# Patient Record
Sex: Female | Born: 1945 | Race: White | Hispanic: No | Smoking: Never smoker
Health system: Southern US, Community
[De-identification: ages and names within clinical notes are randomized; demographics above are authoritative.]

## PROBLEM LIST (undated history)

## (undated) DIAGNOSIS — R112 Nausea with vomiting, unspecified: Secondary | ICD-10-CM

## (undated) DIAGNOSIS — J189 Pneumonia, unspecified organism: Secondary | ICD-10-CM

## (undated) DIAGNOSIS — J45909 Unspecified asthma, uncomplicated: Secondary | ICD-10-CM

## (undated) DIAGNOSIS — C801 Malignant (primary) neoplasm, unspecified: Secondary | ICD-10-CM

## (undated) DIAGNOSIS — I10 Essential (primary) hypertension: Secondary | ICD-10-CM

## (undated) DIAGNOSIS — F419 Anxiety disorder, unspecified: Secondary | ICD-10-CM

## (undated) DIAGNOSIS — Z9889 Other specified postprocedural states: Secondary | ICD-10-CM

## (undated) DIAGNOSIS — Z9189 Other specified personal risk factors, not elsewhere classified: Secondary | ICD-10-CM

## (undated) DIAGNOSIS — T4145XA Adverse effect of unspecified anesthetic, initial encounter: Secondary | ICD-10-CM

## (undated) DIAGNOSIS — H9319 Tinnitus, unspecified ear: Secondary | ICD-10-CM

## (undated) DIAGNOSIS — R7303 Prediabetes: Secondary | ICD-10-CM

## (undated) DIAGNOSIS — Z889 Allergy status to unspecified drugs, medicaments and biological substances status: Secondary | ICD-10-CM

## (undated) DIAGNOSIS — Z8709 Personal history of other diseases of the respiratory system: Secondary | ICD-10-CM

## (undated) DIAGNOSIS — N189 Chronic kidney disease, unspecified: Secondary | ICD-10-CM

## (undated) DIAGNOSIS — K219 Gastro-esophageal reflux disease without esophagitis: Secondary | ICD-10-CM

## (undated) DIAGNOSIS — M1990 Unspecified osteoarthritis, unspecified site: Secondary | ICD-10-CM

## (undated) HISTORY — PX: EYE SURGERY: SHX253

## (undated) HISTORY — PX: OTHER SURGICAL HISTORY: SHX169

## (undated) HISTORY — PX: TONSILLECTOMY: SUR1361

## (undated) HISTORY — PX: ABDOMINAL HYSTERECTOMY: SHX81

---

## 1987-12-30 HISTORY — PX: OTHER SURGICAL HISTORY: SHX169

## 2011-12-30 HISTORY — PX: OTHER SURGICAL HISTORY: SHX169

## 2015-12-28 DIAGNOSIS — J4540 Moderate persistent asthma, uncomplicated: Secondary | ICD-10-CM | POA: Insufficient documentation

## 2015-12-28 DIAGNOSIS — K219 Gastro-esophageal reflux disease without esophagitis: Secondary | ICD-10-CM | POA: Insufficient documentation

## 2016-06-30 ENCOUNTER — Other Ambulatory Visit: Payer: Self-pay | Admitting: Physician Assistant

## 2016-07-03 ENCOUNTER — Encounter (HOSPITAL_COMMUNITY): Payer: Self-pay

## 2016-07-03 ENCOUNTER — Ambulatory Visit (HOSPITAL_COMMUNITY)
Admission: RE | Admit: 2016-07-03 | Discharge: 2016-07-03 | Disposition: A | Discharge status: Home / Self Care | Payer: Medicare Other | Source: Ambulatory Visit | Attending: Anesthesiology | Admitting: Anesthesiology

## 2016-07-03 ENCOUNTER — Encounter (HOSPITAL_COMMUNITY)
Admission: RE | Admit: 2016-07-03 | Discharge: 2016-07-03 | Disposition: A | Discharge status: Home / Self Care | Payer: Medicare Other | Source: Ambulatory Visit | Attending: Orthopaedic Surgery | Admitting: Orthopaedic Surgery

## 2016-07-03 DIAGNOSIS — I700 Atherosclerosis of aorta: Secondary | ICD-10-CM | POA: Insufficient documentation

## 2016-07-03 DIAGNOSIS — I10 Essential (primary) hypertension: Secondary | ICD-10-CM | POA: Insufficient documentation

## 2016-07-03 DIAGNOSIS — J45909 Unspecified asthma, uncomplicated: Secondary | ICD-10-CM | POA: Diagnosis not present

## 2016-07-03 HISTORY — DX: Unspecified asthma, uncomplicated: J45.909

## 2016-07-03 HISTORY — DX: Gastro-esophageal reflux disease without esophagitis: K21.9

## 2016-07-03 HISTORY — DX: Adverse effect of unspecified anesthetic, initial encounter: T41.45XA

## 2016-07-03 HISTORY — DX: Unspecified osteoarthritis, unspecified site: M19.90

## 2016-07-03 HISTORY — DX: Personal history of other diseases of the respiratory system: Z87.09

## 2016-07-03 HISTORY — DX: Other specified personal risk factors, not elsewhere classified: Z91.89

## 2016-07-03 HISTORY — DX: Pneumonia, unspecified organism: J18.9

## 2016-07-03 HISTORY — DX: Allergy status to unspecified drugs, medicaments and biological substances: Z88.9

## 2016-07-03 HISTORY — DX: Tinnitus, unspecified ear: H93.19

## 2016-07-03 HISTORY — DX: Other specified postprocedural states: Z98.890

## 2016-07-03 HISTORY — DX: Essential (primary) hypertension: I10

## 2016-07-03 HISTORY — DX: Nausea with vomiting, unspecified: R11.2

## 2016-07-03 LAB — CBC
HCT: 41 % (ref 36.0–46.0)
HEMOGLOBIN: 13.9 g/dL (ref 12.0–15.0)
MCH: 30 pg (ref 26.0–34.0)
MCHC: 33.9 g/dL (ref 30.0–36.0)
MCV: 88.4 fL (ref 78.0–100.0)
Platelets: 202 10*3/uL (ref 150–400)
RBC: 4.64 MIL/uL (ref 3.87–5.11)
RDW: 13.7 % (ref 11.5–15.5)
WBC: 7.3 10*3/uL (ref 4.0–10.5)

## 2016-07-03 LAB — BASIC METABOLIC PANEL
ANION GAP: 7 (ref 5–15)
BUN: 21 mg/dL — ABNORMAL HIGH (ref 6–20)
CALCIUM: 10.3 mg/dL (ref 8.9–10.3)
CO2: 27 mmol/L (ref 22–32)
Chloride: 105 mmol/L (ref 101–111)
Creatinine, Ser: 0.83 mg/dL (ref 0.44–1.00)
GFR calc Af Amer: >60 mL/min (ref >60)
GFR calc non Af Amer: >60 mL/min (ref >60)
GLUCOSE: 108 mg/dL — AB (ref 65–99)
Potassium: 6.1 mmol/L — ABNORMAL HIGH (ref 3.5–5.1)
Sodium: 139 mmol/L (ref 135–145)

## 2016-07-03 LAB — SURGICAL PCR SCREEN
MRSA, PCR: NEGATIVE
STAPHYLOCOCCUS AUREUS: NEGATIVE

## 2016-07-03 NOTE — Progress Notes (Signed)
Spoke with Dr Jenell MillinerSinger/anesthesia in regards to pts anesthesia difficulties. Requested that records be obtained prior to surgery if possible. Anethesia to see pt day of surgery.

## 2016-07-03 NOTE — Patient Instructions (Signed)
[facility]  07/03/2016   Your procedure is scheduled on: Friday July 11, 2016  [facility] [place]  elevators to 3rd floor to  [facility] at 5:30 AM.  Call this number if you have problems the morning of surgery [phone]   Remember: ONLY 1 PERSON MAY GO WITH YOU TO [facility] TO GET  READY MORNING OF YOUR SURGERY.  Do not eat food or drink liquids :After Midnight.     Take these medicines the morning of surgery with A SIP OF WATER: May take Alprazolam (Xanax) if needed; May use Albuterol Inhaler if needed (bring with you day of surgery); Dulera Inhaler (bring with you day of surgery); Cetirizine (Zyrtec); May use Flonase Nasal Spray if needed (bring with you day of surgery); Doxycycline (if back on dose schedule); Metoprolol; Singulair; Omeprazole                                 You may not have any metal on your body including hair pins and              piercings  Do not wear jewelry, make-up, lotions, powders or perfumes, deodorant             Do not wear nail polish.  Do not shave  48 hours prior to surgery.                 Do not bring valuables to the [facility]. [facility] IS NOT             RESPONSIBLE   FOR VALUABLES.  Contacts, dentures or bridgework may not be worn into surgery.  Leave suitcase in the car. After surgery it may be brought to your room.              Please read over the following fact sheets you were given:MRSA INFORMATION SHEET; INCENTIVE SPIROMETER  _____________________________________________________________________             [facility] Before surgery, you can play an important role.  Because skin is not sterile, your skin needs to be as free of germs as possible.  You can reduce the number of germs on your skin by washing with CHG (chlorahexidine gluconate) soap before surgery.  CHG is an antiseptic cleaner which kills germs and bonds with the skin to continue  killing germs even after washing. Please DO NOT use if you have an allergy to CHG or antibacterial soaps.  If your skin becomes reddened/irritated stop using the CHG and inform your nurse when you arrive at [facility]. Do not shave (including legs and underarms) for at least 48 hours prior to the first CHG shower.  You may shave your face/neck. Please follow these instructions carefully:  1.  Shower with CHG Soap the night before surgery and the  morning of Surgery.  2.  If you choose to wash your hair, wash your hair first as usual with your  normal  shampoo.  3.  After you shampoo, rinse your hair and body thoroughly to remove the  shampoo.                           4.  Use CHG as you would any other liquid  soap.  You can apply chg directly  to the skin and wash                       Gently with a scrungie or clean washcloth.  5.  Apply the CHG Soap to your body ONLY FROM THE NECK DOWN.   Do not use on face/ open                           Wound or open sores. Avoid contact with eyes, ears mouth and genitals (private parts).                       Wash face,  Genitals (private parts) with your normal soap.             6.  Wash thoroughly, paying special attention to the area where your surgery  will be performed.  7.  Thoroughly rinse your body with warm water from the neck down.  8.  DO NOT shower/wash with your normal soap after using and rinsing off  the CHG Soap.                9.  Pat yourself dry with a clean towel.            10.  Wear clean pajamas.            11.  Place clean sheets on your bed the night of your first shower and do not  sleep with pets. Day of Surgery : Do not apply any lotions/deodorants the morning of surgery.  Please wear clean clothes to the Cole/surgery Cole.  FAILURE TO FOLLOW THESE INSTRUCTIONS MAY RESULT IN THE CANCELLATION OF YOUR SURGERY PATIENT SIGNATURE_________________________________  NURSE  SIGNATURE__________________________________  ________________________________________________________________________   [facility]  An incentive spirometer is a tool that can help keep your lungs clear and active. This tool measures how well you are filling your lungs with each breath. Taking long deep breaths may help reverse or decrease the chance of developing breathing (pulmonary) problems (especially infection) following:  A long period of time when you are unable to move or be active. BEFORE THE PROCEDURE   If the spirometer includes an indicator to show your best effort, your nurse or respiratory therapist will set it to a desired goal.  If possible, sit up straight or lean slightly forward. Try not to slouch.  Hold the incentive spirometer in an upright position. INSTRUCTIONS FOR USE  1. Sit on the edge of your bed if possible, or sit up as far as you can in bed or on a chair. 2. Hold the incentive spirometer in an upright position. 3. Breathe out normally. 4. Place the mouthpiece in your mouth and seal your lips tightly around it. 5. Breathe in slowly and as deeply as possible, raising the piston or the ball toward the top of the column. 6. Hold your breath for 3-5 seconds or for as long as possible. Allow the piston or ball to fall to the bottom of the column. 7. Remove the mouthpiece from your mouth and breathe out normally. 8. Rest for a few seconds and repeat Steps 1 through 7 at least 10 times every 1-2 hours when you are awake. Take your time and take a few normal breaths between deep breaths. 9. The spirometer may include an indicator to show your best effort. Use the indicator as a goal to  work toward during each repetition. 10. After each set of 10 deep breaths, practice coughing to be sure your lungs are clear. If you have an incision (the cut made at the time of surgery), support your incision when coughing by placing a pillow or rolled up towels firmly  against it. Once you are able to get out of bed, walk around indoors and cough well. You may stop using the incentive spirometer when instructed by your caregiver.  RISKS AND COMPLICATIONS  Take your time so you do not get dizzy or light-headed.  If you are in pain, you may need to take or ask for pain medication before doing incentive spirometry. It is harder to take a deep breath if you are having pain. AFTER USE  Rest and breathe slowly and easily.  It can be helpful to keep track of a log of your progress. Your caregiver can provide you with a simple table to help with this. If you are using the spirometer at home, follow these instructions: [place] IF:   You are having difficultly using the spirometer.  You have trouble using the spirometer as often as instructed.  Your pain medication is not giving enough relief while using the spirometer.  You develop fever of 100.5 F (38.1 C) or higher. SEEK IMMEDIATE MEDICAL CARE IF:   You cough up bloody sputum that had not been present before.  You develop fever of 102 F (38.9 C) or greater.  You develop worsening pain at or near the incision site. MAKE SURE YOU:   Understand these instructions.  Will watch your condition.  Will get help right away if you are not doing well or get worse. Document Released: 04/27/2007 Document Revised: 03/08/2012 Document Reviewed: 06/28/2007 [facility] Patient Information 2014 [place], [place].   ________________________________________________________________________

## 2016-07-03 NOTE — Progress Notes (Signed)
BMP results per epic per PAT visit 07/03/2016 sent to Dr Maureen Ralphs Blackman

## 2016-07-07 NOTE — Progress Notes (Signed)
colonscopy records per chart 04/16/2016 [facility]

## 2016-07-11 ENCOUNTER — Encounter (HOSPITAL_COMMUNITY): Payer: Self-pay | Admitting: *Deleted

## 2016-07-11 ENCOUNTER — Inpatient Hospital Stay (HOSPITAL_COMMUNITY): Payer: Medicare Other | Admitting: Registered Nurse

## 2016-07-11 ENCOUNTER — Inpatient Hospital Stay (HOSPITAL_COMMUNITY): Payer: Medicare Other

## 2016-07-11 ENCOUNTER — Inpatient Hospital Stay (HOSPITAL_COMMUNITY)
Admission: RE | Admit: 2016-07-11 | Discharge: 2016-07-14 | DRG: 470 | Disposition: A | Discharge status: Home Health | Payer: Medicare Other | Source: Ambulatory Visit | Attending: Orthopaedic Surgery | Admitting: Orthopaedic Surgery

## 2016-07-11 ENCOUNTER — Encounter (HOSPITAL_COMMUNITY)
Admission: RE | Disposition: A | Discharge status: Home Health | Payer: Self-pay | Source: Ambulatory Visit | Attending: Orthopaedic Surgery

## 2016-07-11 DIAGNOSIS — Z9842 Cataract extraction status, left eye: Secondary | ICD-10-CM | POA: Diagnosis not present

## 2016-07-11 DIAGNOSIS — I10 Essential (primary) hypertension: Secondary | ICD-10-CM | POA: Diagnosis not present

## 2016-07-11 DIAGNOSIS — K219 Gastro-esophageal reflux disease without esophagitis: Secondary | ICD-10-CM | POA: Diagnosis present

## 2016-07-11 DIAGNOSIS — Z961 Presence of intraocular lens: Secondary | ICD-10-CM | POA: Diagnosis not present

## 2016-07-11 DIAGNOSIS — M1711 Unilateral primary osteoarthritis, right knee: Principal | ICD-10-CM | POA: Diagnosis present

## 2016-07-11 DIAGNOSIS — J45909 Unspecified asthma, uncomplicated: Secondary | ICD-10-CM | POA: Diagnosis not present

## 2016-07-11 DIAGNOSIS — Z888 Allergy status to other drugs, medicaments and biological substances status: Secondary | ICD-10-CM

## 2016-07-11 DIAGNOSIS — Z96651 Presence of right artificial knee joint: Secondary | ICD-10-CM

## 2016-07-11 DIAGNOSIS — M25561 Pain in right knee: Secondary | ICD-10-CM | POA: Diagnosis present

## 2016-07-11 DIAGNOSIS — Z9841 Cataract extraction status, right eye: Secondary | ICD-10-CM | POA: Diagnosis not present

## 2016-07-11 HISTORY — PX: TOTAL KNEE ARTHROPLASTY: SHX125

## 2016-07-11 SURGERY — ARTHROPLASTY, KNEE, TOTAL
Anesthesia: Spinal | Site: Knee | Laterality: Right

## 2016-07-11 MED ORDER — KETOROLAC TROMETHAMINE 30 MG/ML IJ SOLN: 30.0000 mg | Freq: Once | INTRAMUSCULAR | Status: DC

## 2016-07-11 MED ORDER — PROPOFOL 10 MG/ML IV BOLUS
INTRAVENOUS | Status: DC | PRN
  Administered 2016-07-11 (×3): 20 mg via INTRAVENOUS

## 2016-07-11 MED ORDER — PROPOFOL 10 MG/ML IV BOLUS
INTRAVENOUS | Status: AC
  Filled 2016-07-11: qty 40

## 2016-07-11 MED ORDER — CHLORHEXIDINE GLUCONATE 4 % EX LIQD: 60.0000 mL | Freq: Once | CUTANEOUS | Status: DC

## 2016-07-11 MED ORDER — CEFAZOLIN SODIUM-DEXTROSE 2-4 GM/100ML-% IV SOLN
2.0000 g | INTRAVENOUS | Status: AC
  Administered 2016-07-11: 2 g via INTRAVENOUS
  Filled 2016-07-11: qty 100

## 2016-07-11 MED ORDER — PHENOL 1.4 % MT LIQD
1.0000 | OROMUCOSAL | Status: DC | PRN
Start: 2016-07-11 — End: 2016-07-14
  Administered 2016-07-11: 1 via OROMUCOSAL
  Filled 2016-07-11: qty 177

## 2016-07-11 MED ORDER — METHOCARBAMOL 500 MG PO TABS
500.0000 mg | ORAL_TABLET | Freq: Four times a day (QID) | ORAL | Status: DC | PRN
Start: 2016-07-11 — End: 2016-07-14
  Filled 2016-07-11 (×3): qty 1

## 2016-07-11 MED ORDER — DEXAMETHASONE SODIUM PHOSPHATE 10 MG/ML IJ SOLN
INTRAMUSCULAR | Status: AC
  Filled 2016-07-11: qty 1

## 2016-07-11 MED ORDER — METHOCARBAMOL 1000 MG/10ML IJ SOLN
500.0000 mg | Freq: Four times a day (QID) | INTRAVENOUS | Status: DC | PRN
  Administered 2016-07-11: 500 mg via INTRAVENOUS
  Filled 2016-07-11: qty 5
  Filled 2016-07-11: qty 550

## 2016-07-11 MED ORDER — PANTOPRAZOLE SODIUM 40 MG PO TBEC
40.0000 mg | DELAYED_RELEASE_TABLET | Freq: Every day | ORAL | Status: DC
  Administered 2016-07-11 – 2016-07-14 (×4): 40 mg via ORAL
  Filled 2016-07-11 (×4): qty 1

## 2016-07-11 MED ORDER — ACETAMINOPHEN 325 MG PO TABS
650.0000 mg | ORAL_TABLET | Freq: Four times a day (QID) | ORAL | Status: DC | PRN
  Administered 2016-07-12 – 2016-07-13 (×2): 650 mg via ORAL
  Filled 2016-07-11 (×2): qty 2

## 2016-07-11 MED ORDER — ZOLPIDEM TARTRATE 5 MG PO TABS: 5.0000 mg | ORAL_TABLET | Freq: Every evening | ORAL | Status: DC | PRN

## 2016-07-11 MED ORDER — MIDAZOLAM HCL 2 MG/2ML IJ SOLN
INTRAMUSCULAR | Status: AC
  Filled 2016-07-11: qty 2

## 2016-07-11 MED ORDER — LABETALOL HCL 5 MG/ML IV SOLN
INTRAVENOUS | Status: AC
  Filled 2016-07-11: qty 4

## 2016-07-11 MED ORDER — ACETAMINOPHEN 650 MG RE SUPP: 650.0000 mg | Freq: Four times a day (QID) | RECTAL | Status: DC | PRN

## 2016-07-11 MED ORDER — DOCUSATE SODIUM 100 MG PO CAPS
100.0000 mg | ORAL_CAPSULE | Freq: Two times a day (BID) | ORAL | Status: DC
  Administered 2016-07-11 – 2016-07-14 (×6): 100 mg via ORAL
  Filled 2016-07-11 (×6): qty 1

## 2016-07-11 MED ORDER — DIPHENHYDRAMINE HCL 12.5 MG/5ML PO ELIX: 12.5000 mg | ORAL_SOLUTION | ORAL | Status: DC | PRN

## 2016-07-11 MED ORDER — FENTANYL CITRATE (PF) 100 MCG/2ML IJ SOLN: 25.0000 ug | INTRAMUSCULAR | Status: DC | PRN

## 2016-07-11 MED ORDER — LACTATED RINGERS IV SOLN
INTRAVENOUS | Status: DC | PRN
  Administered 2016-07-11 (×3): via INTRAVENOUS
  Administered 2016-07-11: 10:00:00

## 2016-07-11 MED ORDER — 0.9 % SODIUM CHLORIDE (POUR BTL) OPTIME
TOPICAL | Status: DC | PRN
  Administered 2016-07-11: 1000 mL

## 2016-07-11 MED ORDER — FENTANYL CITRATE (PF) 100 MCG/2ML IJ SOLN
INTRAMUSCULAR | Status: AC
  Filled 2016-07-11: qty 2

## 2016-07-11 MED ORDER — FLUTICASONE PROPIONATE 50 MCG/ACT NA SUSP
1.0000 | Freq: Every day | NASAL | Status: DC
  Administered 2016-07-12 – 2016-07-14 (×3): 1 via NASAL
  Filled 2016-07-11: qty 16

## 2016-07-11 MED ORDER — DOXYCYCLINE HYCLATE 100 MG PO TABS
50.0000 mg | ORAL_TABLET | ORAL | Status: DC
  Administered 2016-07-12 – 2016-07-14 (×2): 50 mg via ORAL
  Filled 2016-07-11 (×2): qty 1

## 2016-07-11 MED ORDER — CALCIUM CARBONATE 1250 (500 CA) MG PO TABS
1.0000 | ORAL_TABLET | Freq: Every day | ORAL | Status: DC
  Administered 2016-07-11 – 2016-07-14 (×3): 500 mg via ORAL
  Filled 2016-07-11 (×8): qty 1

## 2016-07-11 MED ORDER — ALPRAZOLAM 0.25 MG PO TABS
0.2500 mg | ORAL_TABLET | Freq: Two times a day (BID) | ORAL | Status: DC | PRN
  Administered 2016-07-11: 0.25 mg via ORAL
  Filled 2016-07-11: qty 1

## 2016-07-11 MED ORDER — CEFAZOLIN SODIUM-DEXTROSE 2-4 GM/100ML-% IV SOLN
INTRAVENOUS | Status: AC
  Filled 2016-07-11: qty 100

## 2016-07-11 MED ORDER — MENTHOL 3 MG MT LOZG
1.0000 | LOZENGE | OROMUCOSAL | Status: DC | PRN
  Filled 2016-07-11: qty 9

## 2016-07-11 MED ORDER — OXYCODONE HCL 5 MG PO TABS
5.0000 mg | ORAL_TABLET | ORAL | Status: DC | PRN
  Administered 2016-07-11: 10 mg via ORAL
  Administered 2016-07-12 (×2): 5 mg via ORAL
  Administered 2016-07-12 – 2016-07-14 (×9): 10 mg via ORAL
  Filled 2016-07-11 (×5): qty 2
  Filled 2016-07-11: qty 1
  Filled 2016-07-11 (×2): qty 2
  Filled 2016-07-11: qty 1
  Filled 2016-07-11 (×4): qty 2

## 2016-07-11 MED ORDER — ADULT MULTIVITAMIN W/MINERALS CH
1.0000 | ORAL_TABLET | Freq: Every day | ORAL | Status: DC
  Administered 2016-07-12 – 2016-07-14 (×2): 1 via ORAL
  Filled 2016-07-11 (×4): qty 1

## 2016-07-11 MED ORDER — GUAIFENESIN ER 600 MG PO TB12
600.0000 mg | ORAL_TABLET | Freq: Two times a day (BID) | ORAL | Status: DC
  Administered 2016-07-11 – 2016-07-14 (×6): 600 mg via ORAL
  Filled 2016-07-11 (×6): qty 1

## 2016-07-11 MED ORDER — ALUM & MAG HYDROXIDE-SIMETH 200-200-20 MG/5ML PO SUSP: 30.0000 mL | ORAL | Status: DC | PRN

## 2016-07-11 MED ORDER — ONDANSETRON HCL 4 MG/2ML IJ SOLN: 4.0000 mg | Freq: Once | INTRAMUSCULAR | Status: DC | PRN

## 2016-07-11 MED ORDER — ONDANSETRON HCL 4 MG PO TABS: 4.0000 mg | ORAL_TABLET | Freq: Four times a day (QID) | ORAL | Status: DC | PRN

## 2016-07-11 MED ORDER — MEPERIDINE HCL 25 MG/ML IJ SOLN: 6.2500 mg | INTRAMUSCULAR | Status: DC | PRN

## 2016-07-11 MED ORDER — METOPROLOL TARTRATE 50 MG PO TABS
50.0000 mg | ORAL_TABLET | Freq: Two times a day (BID) | ORAL | Status: DC
  Administered 2016-07-12 – 2016-07-14 (×5): 50 mg via ORAL
  Filled 2016-07-11 (×5): qty 1

## 2016-07-11 MED ORDER — METOCLOPRAMIDE HCL 5 MG/ML IJ SOLN: 5.0000 mg | Freq: Three times a day (TID) | INTRAMUSCULAR | Status: DC | PRN

## 2016-07-11 MED ORDER — POLYETHYLENE GLYCOL 3350 17 G PO PACK: 17.0000 g | PACK | Freq: Every day | ORAL | Status: DC | PRN

## 2016-07-11 MED ORDER — TRANEXAMIC ACID 1000 MG/10ML IV SOLN
1000.0000 mg | INTRAVENOUS | Status: AC
  Administered 2016-07-11: 1000 mg via INTRAVENOUS
  Filled 2016-07-11: qty 10

## 2016-07-11 MED ORDER — DEXAMETHASONE SODIUM PHOSPHATE 10 MG/ML IJ SOLN
INTRAMUSCULAR | Status: DC | PRN
  Administered 2016-07-11: 10 mg via INTRAVENOUS

## 2016-07-11 MED ORDER — LORATADINE 10 MG PO TABS
10.0000 mg | ORAL_TABLET | Freq: Every day | ORAL | Status: DC
  Administered 2016-07-11 – 2016-07-14 (×4): 10 mg via ORAL
  Filled 2016-07-11 (×4): qty 1

## 2016-07-11 MED ORDER — STERILE WATER FOR IRRIGATION IR SOLN
Status: DC | PRN
  Administered 2016-07-11: 2000 mL

## 2016-07-11 MED ORDER — METHYLPREDNISOLONE SODIUM SUCC 125 MG IJ SOLR
125.0000 mg | Freq: Once | INTRAMUSCULAR | Status: AC
  Administered 2016-07-11: 125 mg via INTRAVENOUS
  Filled 2016-07-11: qty 2

## 2016-07-11 MED ORDER — MONTELUKAST SODIUM 10 MG PO TABS
10.0000 mg | ORAL_TABLET | Freq: Every day | ORAL | Status: DC
  Administered 2016-07-11 – 2016-07-14 (×4): 10 mg via ORAL
  Filled 2016-07-11 (×4): qty 1

## 2016-07-11 MED ORDER — ONDANSETRON HCL 4 MG/2ML IJ SOLN
INTRAMUSCULAR | Status: AC
  Filled 2016-07-11: qty 2

## 2016-07-11 MED ORDER — LABETALOL HCL 5 MG/ML IV SOLN
INTRAVENOUS | Status: DC | PRN
  Administered 2016-07-11 (×2): 2.5 mg via INTRAVENOUS

## 2016-07-11 MED ORDER — MOMETASONE FURO-FORMOTEROL FUM 200-5 MCG/ACT IN AERO
2.0000 | INHALATION_SPRAY | Freq: Two times a day (BID) | RESPIRATORY_TRACT | Status: DC
  Administered 2016-07-11 – 2016-07-14 (×6): 2 via RESPIRATORY_TRACT
  Filled 2016-07-11: qty 8.8

## 2016-07-11 MED ORDER — SODIUM CHLORIDE 0.9 % IR SOLN
Status: DC | PRN
  Administered 2016-07-11: 1000 mL

## 2016-07-11 MED ORDER — ACETAMINOPHEN 10 MG/ML IV SOLN
INTRAVENOUS | Status: AC
  Filled 2016-07-11: qty 100

## 2016-07-11 MED ORDER — ACETAMINOPHEN 10 MG/ML IV SOLN
INTRAVENOUS | Status: DC | PRN
  Administered 2016-07-11: 1000 mg via INTRAVENOUS

## 2016-07-11 MED ORDER — NON FORMULARY: 3.0000 [drp] | Freq: Every day | Status: DC

## 2016-07-11 MED ORDER — ALBUTEROL SULFATE (2.5 MG/3ML) 0.083% IN NEBU: 2.5000 mg | INHALATION_SOLUTION | RESPIRATORY_TRACT | Status: DC | PRN

## 2016-07-11 MED ORDER — HOME MED STORE IN PYXIS
1.0000 | Freq: Every day | Status: DC
  Administered 2016-07-12 – 2016-07-14 (×3): 1 via SUBLINGUAL

## 2016-07-11 MED ORDER — METOCLOPRAMIDE HCL 10 MG PO TABS: 5.0000 mg | ORAL_TABLET | Freq: Three times a day (TID) | ORAL | Status: DC | PRN

## 2016-07-11 MED ORDER — HYDROMORPHONE HCL 1 MG/ML IJ SOLN
1.0000 mg | INTRAMUSCULAR | Status: DC | PRN
Start: 2016-07-11 — End: 2016-07-14
  Administered 2016-07-11 (×2): 1 mg via INTRAVENOUS
  Filled 2016-07-11 (×2): qty 1

## 2016-07-11 MED ORDER — CEFAZOLIN IN D5W 1 GM/50ML IV SOLN
1.0000 g | Freq: Four times a day (QID) | INTRAVENOUS | Status: AC
  Administered 2016-07-11 (×2): 1 g via INTRAVENOUS
  Filled 2016-07-11 (×2): qty 50

## 2016-07-11 MED ORDER — BUPIVACAINE IN DEXTROSE 0.75-8.25 % IT SOLN
INTRATHECAL | Status: DC | PRN
  Administered 2016-07-11: 1.8 mL via INTRATHECAL

## 2016-07-11 MED ORDER — PROPOFOL 10 MG/ML IV BOLUS
INTRAVENOUS | Status: AC
  Filled 2016-07-11: qty 20

## 2016-07-11 MED ORDER — PHENYLEPHRINE HCL 10 MG/ML IJ SOLN
10.0000 mg | INTRAMUSCULAR | Status: DC | PRN
  Administered 2016-07-11: 30 ug/min via INTRAVENOUS

## 2016-07-11 MED ORDER — KETOROLAC TROMETHAMINE 15 MG/ML IJ SOLN
7.5000 mg | Freq: Four times a day (QID) | INTRAMUSCULAR | Status: AC
  Administered 2016-07-11 – 2016-07-12 (×4): 7.5 mg via INTRAVENOUS
  Filled 2016-07-11 (×4): qty 1

## 2016-07-11 MED ORDER — ASPIRIN EC 325 MG PO TBEC
325.0000 mg | DELAYED_RELEASE_TABLET | Freq: Two times a day (BID) | ORAL | Status: DC
  Administered 2016-07-11 – 2016-07-14 (×6): 325 mg via ORAL
  Filled 2016-07-11 (×6): qty 1

## 2016-07-11 MED ORDER — ONDANSETRON HCL 4 MG/2ML IJ SOLN
4.0000 mg | Freq: Four times a day (QID) | INTRAMUSCULAR | Status: DC | PRN
Start: 2016-07-11 — End: 2016-07-14
  Administered 2016-07-11 – 2016-07-13 (×4): 4 mg via INTRAVENOUS
  Filled 2016-07-11 (×4): qty 2

## 2016-07-11 MED ORDER — ONDANSETRON HCL 4 MG/2ML IJ SOLN
INTRAMUSCULAR | Status: DC | PRN
  Administered 2016-07-11: 4 mg via INTRAVENOUS

## 2016-07-11 MED ORDER — PROPOFOL 500 MG/50ML IV EMUL
INTRAVENOUS | Status: DC | PRN
  Administered 2016-07-11: 100 ug/kg/min via INTRAVENOUS

## 2016-07-11 MED ORDER — FENTANYL CITRATE (PF) 250 MCG/5ML IJ SOLN
INTRAMUSCULAR | Status: DC | PRN
  Administered 2016-07-11: 50 ug via INTRAVENOUS

## 2016-07-11 SURGICAL SUPPLY — 54 items
BAG ZIPLOCK 12X15 (MISCELLANEOUS) ×3 IMPLANT
BANDAGE ACE 6X5 VEL STRL LF (GAUZE/BANDAGES/DRESSINGS) ×6 IMPLANT
BENZOIN TINCTURE PRP APPL 2/3 (GAUZE/BANDAGES/DRESSINGS) ×3 IMPLANT
BLADE SAG 13.0X1.37X90 (BLADE) IMPLANT
BLADE SAG 18X100X1.27 (BLADE) IMPLANT
BOWL SMART MIX CTS (DISPOSABLE) ×3 IMPLANT
CAPT KNEE TOTAL 3 ×3 IMPLANT
CEMENT BONE SIMPLEX SPEEDSET (Cement) ×6 IMPLANT
CLOSURE WOUND 1/2 X4 (GAUZE/BANDAGES/DRESSINGS) ×1
CLOTH BEACON ORANGE TIMEOUT ST (SAFETY) ×3 IMPLANT
CUFF TOURN SGL QUICK 34 (TOURNIQUET CUFF) ×2
CUFF TRNQT CYL 34X4X40X1 (TOURNIQUET CUFF) ×1 IMPLANT
DRAPE U-SHAPE 47X51 STRL (DRAPES) ×3 IMPLANT
DRSG PAD ABDOMINAL 8X10 ST (GAUZE/BANDAGES/DRESSINGS) ×6 IMPLANT
DURAPREP 26ML APPLICATOR (WOUND CARE) ×3 IMPLANT
ELECT REM PT RETURN 9FT ADLT (ELECTROSURGICAL) ×3
ELECTRODE REM PT RTRN 9FT ADLT (ELECTROSURGICAL) ×1 IMPLANT
GAUZE SPONGE 4X4 12PLY STRL (GAUZE/BANDAGES/DRESSINGS) ×3 IMPLANT
GLOVE BIO SURGEON STRL SZ7.5 (GLOVE) ×3 IMPLANT
GLOVE BIOGEL PI IND STRL 7.0 (GLOVE) ×1 IMPLANT
GLOVE BIOGEL PI IND STRL 7.5 (GLOVE) ×3 IMPLANT
GLOVE BIOGEL PI IND STRL 8 (GLOVE) ×2 IMPLANT
GLOVE BIOGEL PI INDICATOR 7.0 (GLOVE) ×2
GLOVE BIOGEL PI INDICATOR 7.5 (GLOVE) ×6
GLOVE BIOGEL PI INDICATOR 8 (GLOVE) ×4
GLOVE ECLIPSE 8.0 STRL XLNG CF (GLOVE) ×3 IMPLANT
GLOVE SS PI 9.0 STRL (GLOVE) ×6 IMPLANT
GLOVE SURG SS PI 7.0 STRL IVOR (GLOVE) ×3 IMPLANT
GLOVE SURG SS PI 7.5 STRL IVOR (GLOVE) ×3 IMPLANT
GOWN SPEC L3 XXLG W/TWL (GOWN DISPOSABLE) ×3 IMPLANT
GOWN STRL REUS W/ TWL XL LVL3 (GOWN DISPOSABLE) ×1 IMPLANT
GOWN STRL REUS W/TWL LRG LVL3 (GOWN DISPOSABLE) ×3 IMPLANT
GOWN STRL REUS W/TWL XL LVL3 (GOWN DISPOSABLE) ×8 IMPLANT
HANDPIECE INTERPULSE COAX TIP (DISPOSABLE) ×2
IMMOBILIZER KNEE 16 (SOFTGOODS) ×3 IMPLANT
PACK TOTAL KNEE CUSTOM (KITS) ×3 IMPLANT
PAD ABD 8X10 STRL (GAUZE/BANDAGES/DRESSINGS) ×3 IMPLANT
PADDING CAST COTTON 6X4 STRL (CAST SUPPLIES) ×3 IMPLANT
POSITIONER SURGICAL ARM (MISCELLANEOUS) ×3 IMPLANT
SET HNDPC FAN SPRY TIP SCT (DISPOSABLE) ×1 IMPLANT
SET PAD KNEE POSITIONER (MISCELLANEOUS) ×3 IMPLANT
STRIP CLOSURE SKIN 1/2X4 (GAUZE/BANDAGES/DRESSINGS) ×2 IMPLANT
SUCTION FRAZIER HANDLE 12FR (TUBING) ×2
SUCTION TUBE FRAZIER 12FR DISP (TUBING) ×1 IMPLANT
SUT MNCRL AB 4-0 PS2 18 (SUTURE) ×3 IMPLANT
SUT VIC AB 0 CT1 27 (SUTURE) ×2
SUT VIC AB 0 CT1 27XBRD ANTBC (SUTURE) ×1 IMPLANT
SUT VIC AB 1 CT1 27 (SUTURE) ×4
SUT VIC AB 1 CT1 27XBRD ANTBC (SUTURE) ×2 IMPLANT
SUT VIC AB 2-0 CT1 27 (SUTURE) ×4
SUT VIC AB 2-0 CT1 TAPERPNT 27 (SUTURE) ×2 IMPLANT
TRAY FOLEY W/METER SILVER 14FR (SET/KITS/TRAYS/PACK) ×3 IMPLANT
WRAP KNEE MAXI GEL POST OP (GAUZE/BANDAGES/DRESSINGS) ×3 IMPLANT
YANKAUER SUCT BULB TIP 10FT TU (MISCELLANEOUS) ×3 IMPLANT

## 2016-07-11 NOTE — Anesthesia Procedure Notes (Addendum)
Spinal Patient location during procedure: OR End time: 07/11/2016 7:36 AM Staffing Resident/CRNA: Enrigue Catena E Performed by: resident/CRNA  Preanesthetic Checklist Completed: patient identified, site marked, surgical consent, pre-op evaluation, timeout performed, IV checked, risks and benefits discussed and monitors and equipment checked Spinal Block Patient position: sitting Prep: ChloraPrep Patient monitoring: continuous pulse ox and blood pressure Approach: right paramedian Location: L3-4 Injection technique: single-shot Needle Needle type: Sprotte and Spinocan  Needle gauge: 22 G Needle length: 9 cm Assessment Sensory level: T6 Additional Notes Expiration date of kit checked and confirmed. Patient tolerated procedure well, without complications.  CSF x 3 negative heme or paresthsia

## 2016-07-11 NOTE — Anesthesia Postprocedure Evaluation (Signed)
Anesthesia Post Note  Patient: Shaia S ThomBonnee Quinas  Procedure(s) Performed: Procedure(s) (LRB): RIGHT TOTAL KNEE ARTHROPLASTY (Right)  Patient location during evaluation: PACU Anesthesia Type: Spinal Level of consciousness: awake Pain management: pain level controlled Vital Signs Assessment: post-procedure vital signs reviewed and stable Respiratory status: spontaneous breathing Cardiovascular status: stable Postop Assessment: no headache, no backache, spinal receding, patient able to bend at knees and no signs of nausea or vomiting Anesthetic complications: no     Last Vitals:  Filed Vitals:   07/11/16 1015 07/11/16 1030  BP: 137/64 136/65  Pulse: 54 52  Temp:  36.3 C  Resp: 11 11    Last Pain: There were no vitals filed for this visit. Pain Goal: Patients Stated Pain Goal: 0 (07/11/16 1000)               Dysen Edmondson JR,JOHN Susann GivensFRANKLIN

## 2016-07-11 NOTE — Transfer of Care (Signed)
Immediate Anesthesia Transfer of Care Note  Patient: [facility]  Procedure(s) Performed: Procedure(s): RIGHT TOTAL KNEE ARTHROPLASTY (Right)  Patient Location: PACU  Anesthesia Type:Spinal  Level of Consciousness: awake, alert , oriented and patient cooperative  Airway & Oxygen Therapy: Patient Spontanous Breathing and Patient connected to face mask oxygen  Post-op Assessment: Report given to RN and Post -op Vital signs reviewed and stable  Post vital signs: stable  Last Vitals:  Filed Vitals:   07/11/16 0539 07/11/16 0930  BP: 150/62 159/77  Pulse: 67 54  Temp: 36.6 C 36.3 C  Resp: 16 14    Last Pain: There were no vitals filed for this visit.    Patients Stated Pain Goal: 0 (07/11/16 0930)  Complications: No apparent anesthesia complications

## 2016-07-11 NOTE — Brief Op Note (Signed)
07/11/2016  9:02 AM  PATIENT:  Bonnee QuinVivian S Mcclusky  70 y.o. female  PRE-OPERATIVE DIAGNOSIS:  End stage arthritis right knee  POST-OPERATIVE DIAGNOSIS:  End stage arthritis right knee  PROCEDURE:  Procedure(s): RIGHT TOTAL KNEE ARTHROPLASTY (Right)  SURGEON:  Surgeon(s) and Role:    * Kathryne Hitchhristopher Y Ritesh Opara, MD - Primary  PHYSICIAN ASSISTANT: Rexene EdisonGil Clark, PA-C  ANESTHESIA:   spinal  EBL:  Total I/O In: 2000 [I.V.:2000] Out: 325 [Urine:225; Blood:100]  COUNTS:  YES  TOURNIQUET:   Total Tourniquet Time Documented: Thigh (Right) - 46 minutes Total: Thigh (Right) - 46 minutes  PLAN OF CARE: Admit to inpatient   PATIENT DISPOSITION:  PACU - hemodynamically stable.   Delay start of Pharmacological VTE agent (>24hrs) due to surgical blood loss or risk of bleeding: no

## 2016-07-11 NOTE — Anesthesia Preprocedure Evaluation (Signed)
Anesthesia Evaluation  Patient identified by MRN, date of birth, ID band Patient awake    Reviewed: Allergy & Precautions, H&P , NPO status , Patient's Chart, lab work & pertinent test results, reviewed documented beta blocker date and time   Airway Mallampati: II  TM Distance: >3 FB Neck ROM: full    Dental no notable dental hx.    Pulmonary    Pulmonary exam normal        Cardiovascular Exercise Tolerance: Good hypertension, Pt. on medications and Pt. on home beta blockers Normal cardiovascular exam Rate:Bradycardia     Neuro/Psych negative neurological ROS  negative psych ROS   GI/Hepatic Neg liver ROS,   Endo/Other  negative endocrine ROS  Renal/GU negative Renal ROS  negative genitourinary   Musculoskeletal   Abdominal Normal abdominal exam  (+)   Peds  Hematology negative hematology ROS (+)   Anesthesia Other Findings   Reproductive/Obstetrics negative OB ROS                             Anesthesia Physical Anesthesia Plan  ASA: II  Anesthesia Plan: Spinal   Post-op Pain Management:    Induction:   Airway Management Planned:   Additional Equipment:   Intra-op Plan:   Post-operative Plan:   Informed Consent: I have reviewed the patients History and Physical, chart, labs and discussed the procedure including the risks, benefits and alternatives for the proposed anesthesia with the patient or authorized representative who has indicated his/her understanding and acceptance.     Plan Discussed with: CRNA and Surgeon  Anesthesia Plan Comments:         Anesthesia Quick Evaluation

## 2016-07-11 NOTE — Care Management Note (Signed)
Case Management Note  Patient Details  Name: Bonnee QuinVivian S Camille MRN: [id number] Date of Birth: 11/04/1946  Subjective/Objective:  70 y/o f admitted w/OA R knee. S.p R TKA. From home. Await PT recc.                  Action/Plan:d/c plan home.   Expected Discharge Date:                  Expected Discharge Plan:  Home w Home Health Services  In-House Referral:     Discharge planning Services  CM Consult  Post Acute Care Choice:    Choice offered to:     DME Arranged:    DME Agency:     HH Arranged:    HH Agency:     Status of Service:  In process, will continue to follow  If discussed at Long Length of Stay Meetings, dates discussed:    Additional Comments:  Lanier ClamMahabir, Kenesha Moshier, RN 07/11/2016, 2:52 PM

## 2016-07-11 NOTE — Progress Notes (Signed)
Patient had periods of Sinus Tachycardia and PACs in OR. Patient is Sinus Huston FoleyBrady at base line. Patient is stable in PACU.

## 2016-07-11 NOTE — Care Management Note (Signed)
Case Management Note  Patient Details  Name: Bonnee QuinVivian S Debes MRN: [id number] Date of Birth: 03/06/1946  Subjective/Objective:[facility]/[place] [facility] She already has crutches, & rw.  Await PT recc.                Action/Plan:d/c plan home.   Expected Discharge Date:                  Expected Discharge Plan:  Home w Home Health Services  In-House Referral:     Discharge planning Services  CM Consult  Post Acute Care Choice:    Choice offered to:     DME Arranged:    DME Agency:     HH Arranged:    HH Agency:     Status of Service:  In process, will continue to follow  If discussed at Long Length of Stay Meetings, dates discussed:    Additional Comments:  Lanier ClamMahabir, Loreen Bankson, RN 07/11/2016, 3:52 PM

## 2016-07-11 NOTE — Progress Notes (Signed)
PT states she had Dulera at home prior to coming to hospital for sx. RN has called RT for neb tx due to PT stating she has head congestion and sore throat. PT has clear bilateral BS, Sp02 on RA 97%, no respiratory distress noted at this time. PT states that she normally would take a prednisone shot and receive a steriod script for the same symptoms when they occur at home. RN aware and is communicating with MD and pharmacy. Neb tx not indicated at this time.

## 2016-07-11 NOTE — Care Management Note (Signed)
Case Management Note  Patient Details  Name: [facility] MRN: [id number] Date of Birth: 08/30/1946  Subjective/Objective:  [place] [place] [facility] [facility] [place]they will not be able to accept patient due to not servicing [place] area.  Will provide patient w/HHC agency provider list for [place].                  Action/Plan:d/c plan home w/HHC.   Expected Discharge Date:                  Expected Discharge Plan:  Home w Home Health Services  In-House Referral:     Discharge planning Services  CM Consult  Post Acute Care Choice:    Choice offered to:     DME Arranged:    DME Agency:     HH Arranged:    HH Agency:     Status of Service:  In process, will continue to follow  If discussed at Long Length of Stay Meetings, dates discussed:    Additional Comments:  Lanier ClamMahabir, Brooke Figley, RN 07/11/2016, 3:45 PM

## 2016-07-11 NOTE — H&P (Signed)
TOTAL KNEE ADMISSION H&P  Patient is being admitted for right total knee arthroplasty.  Subjective:  Chief Complaint:right knee pain.  HPI: [facility], 70 y.o. female, has a history of pain and functional disability in the right knee due to arthritis and has failed non-surgical conservative treatments for greater than 12 weeks to includeNSAID's and/or analgesics, corticosteriod injections, flexibility and strengthening excercises and activity modification.  Onset of symptoms was gradual, starting 3 years ago with gradually worsening course since that time. The patient noted no past surgery on the right knee(s).  Patient currently rates pain in the right knee(s) at 10 out of 10 with activity. Patient has night pain, worsening of pain with activity and weight bearing, pain that interferes with activities of daily living, pain with passive range of motion, crepitus and joint swelling.  Patient has evidence of subchondral sclerosis, periarticular osteophytes and joint space narrowing by imaging studies. There is no active infection.  Patient Active Problem List   Diagnosis Date Noted  . Osteoarthritis of right knee 07/11/2016   Past Medical History  Diagnosis Date  . PONV (postoperative nausea and vomiting)     when had colonscopy 03/2016 had very difficult time awakening; pt states had chest hit due to BP dropping dramatically   . Adverse effect of anesthetic     pt states when had partial hysterectomy while in recovery had code blue called on her due to decreased BP   . Hypertension   . Tinnitus   . Asthma   . History of multiple allergies   . Pneumonia   . History of bronchitis     11/2015 last episode   . GERD (gastroesophageal reflux disease)   . Arthritis     Past Surgical History  Procedure Laterality Date  . Tmj bilateral meniscectomy      1989  . Abdominal hysterectomy      partial 2001  . Tonsillectomy      1952  . Broken hand - 3 screws      2013  . Eye surgery      cataract removal bilat with implants    Prescriptions prior to admission  Medication Sig Dispense Refill Last Dose  . albuterol (PROAIR HFA) 108 (90 Base) MCG/ACT inhaler Inhale 2 puffs into the lungs every 4 (four) hours as needed for wheezing or shortness of breath.    07/11/2016 at 0430  . ALPRAZolam (XANAX) 0.25 MG tablet Take 1 tablet by mouth daily as needed.   07/10/2016 at 1500  . calcium carbonate (CALCIUM 600) 600 MG TABS tablet Take 1 tablet by mouth daily.   07/09/2016 at Unknown time  . cetirizine (ZYRTEC) 10 MG tablet Take 10 mg by mouth daily.   07/10/2016 at 2000  . doxycycline (PERIOSTAT) 20 MG tablet Take 20 mg by mouth daily.   07/04/2016  . fluticasone (FLONASE) 50 MCG/ACT nasal spray Place 1 spray into the nose daily.   07/11/2016 at 0400  . lisinopril (PRINIVIL,ZESTRIL) 40 MG tablet Take 40 mg by mouth daily.   07/10/2016 at 0800  . metoprolol (LOPRESSOR) 50 MG tablet Take 1 tablet by mouth 2 (two) times daily.    07/11/2016 at 0400  . mometasone-formoterol (DULERA) 200-5 MCG/ACT AERO Inhale 2 puffs into the lungs 2 (two) times daily.   07/11/2016 at 0400  . montelukast (SINGULAIR) 10 MG tablet Take 1 tablet by mouth daily.   07/10/2016 at 2000  . Multiple Vitamin (MULTIVITAMIN) tablet Take 1 tablet by mouth daily.  07/09/2016 at Unknown time  . Omega-3 Fatty Acids (OMEGA 3 PO) Take 1 capsule by mouth daily.   07/09/2016  . omeprazole (PRILOSEC) 40 MG capsule Take 40 mg by mouth daily.   Past Week at Unknown time  . PRESCRIPTION MEDICATION Allergy drops   Past Week at Unknown time  . EPINEPHrine (EPIPEN 2-PAK) 0.3 mg/0.3 mL IJ SOAJ injection Inject 0.3 mg into the skin once.    Unknown at Unknown time   Allergies  Allergen Reactions  . Actonel [Risedronate Sodium] Cough    Chest pain; difficulty with swallowing   . Maxitrol [Neomycin-Polymyxin-Dexameth] Swelling    Eye swelling; burning of eyes    Social History  Substance Use Topics  . Smoking status: Never Smoker   .  Smokeless tobacco: Never Used  . Alcohol Use: No    History reviewed. No pertinent family history.   Review of Systems  Musculoskeletal: Positive for joint pain.  All other systems reviewed and are negative.   Objective:  Physical Exam  Constitutional: She is oriented to person, place, and time. She appears well-developed and well-nourished.  HENT:  Head: Normocephalic.  Eyes: EOM are normal. Pupils are equal, round, and reactive to light.  Neck: Normal range of motion. Neck supple.  Cardiovascular: Normal rate and regular rhythm.   Respiratory: Effort normal and breath sounds normal.  GI: Soft. Bowel sounds are normal.  Musculoskeletal:       Right knee: She exhibits swelling and effusion. Tenderness found. Medial joint line and lateral joint line tenderness noted.  Neurological: She is alert and oriented to person, place, and time.  Skin: Skin is warm.  Psychiatric: She has a normal mood and affect.    Vital signs in last 24 hours: Temp:  [97.9 F (36.6 C)] 97.9 F (36.6 C) (07/14 0539) Pulse Rate:  [67] 67 (07/14 0539) Resp:  [16] 16 (07/14 0539) BP: (150)/(62) 150/62 mmHg (07/14 0539) SpO2:  [100 %] 100 % (07/14 0539) Weight:  [65.772 kg (145 lb)] 65.772 kg (145 lb) (07/14 0554)  Labs:   Estimated body mass index is 25.69 kg/(m^2) as calculated from the following:   Height as of this encounter: 5\' 3"  (1.6 m).   Weight as of this encounter: 65.772 kg (145 lb).   Imaging Review Plain radiographs demonstrate severe degenerative joint disease of the right knee(s). The overall alignment isneutral. The bone quality appears to be good for age and reported activity level.  Assessment/Plan:  End stage arthritis, right knee   The patient history, physical examination, clinical judgment of the provider and imaging studies are consistent with end stage degenerative joint disease of the right knee(s) and total knee arthroplasty is deemed medically necessary. The treatment  options including medical management, injection therapy arthroscopy and arthroplasty were discussed at length. The risks and benefits of total knee arthroplasty were presented and reviewed. The risks due to aseptic loosening, infection, stiffness, patella tracking problems, thromboembolic complications and other imponderables were discussed. The patient acknowledged the explanation, agreed to proceed with the plan and consent was signed. Patient is being admitted for inpatient treatment for surgery, pain control, PT, OT, prophylactic antibiotics, VTE prophylaxis, progressive ambulation and ADL's and discharge planning. The patient is planning to be discharged home with home health services

## 2016-07-12 LAB — BASIC METABOLIC PANEL
Anion gap: 6 (ref 5–15)
BUN: 20 mg/dL (ref 6–20)
CHLORIDE: 105 mmol/L (ref 101–111)
CO2: 24 mmol/L (ref 22–32)
CREATININE: 0.87 mg/dL (ref 0.44–1.00)
Calcium: 9.3 mg/dL (ref 8.9–10.3)
GFR calc Af Amer: >60 mL/min (ref >60)
GFR calc non Af Amer: >60 mL/min (ref >60)
GLUCOSE: 174 mg/dL — AB (ref 65–99)
Potassium: 4.7 mmol/L (ref 3.5–5.1)
Sodium: 135 mmol/L (ref 135–145)

## 2016-07-12 LAB — CBC
HEMATOCRIT: 33.8 % — AB (ref 36.0–46.0)
HEMOGLOBIN: 10.9 g/dL — AB (ref 12.0–15.0)
MCH: 29.5 pg (ref 26.0–34.0)
MCHC: 32.2 g/dL (ref 30.0–36.0)
MCV: 91.6 fL (ref 78.0–100.0)
Platelets: 137 10*3/uL — ABNORMAL LOW (ref 150–400)
RBC: 3.69 MIL/uL — ABNORMAL LOW (ref 3.87–5.11)
RDW: 13.9 % (ref 11.5–15.5)
WBC: 10.5 10*3/uL (ref 4.0–10.5)

## 2016-07-12 NOTE — Evaluation (Signed)
Occupational Therapy Evaluation Patient Details Name: Brooke Cole MRN: [id number] DOB: 03-19-1946 Today's Date: 07/12/2016    History of Present Illness Pt is a 70 year old female s/p R TKA    Clinical Impression   Pt is s/p TKA resulting in the deficits listed below (see OT Problem List).  Pt will benefit from skilled OT to increase their safety and independence with ADL and functional mobility for ADL to facilitate discharge to venue listed below.      Follow Up Recommendations  Home health OT;Supervision - Intermittent    Equipment Recommendations  3 in 1 bedside comode       Precautions / Restrictions Precautions Precautions: Fall;Knee Required Braces or Orthoses: Knee Immobilizer - Right Restrictions RLE Weight Bearing: Weight bearing as tolerated      Mobility Bed Mobility Overal bed mobility: Needs Assistance Bed Mobility: Supine to Sit     Supine to sit: Min assist     General bed mobility comments: pt in chair  Transfers Overall transfer level: Needs assistance Equipment used: Rolling walker (2 wheeled) Transfers: Sit to/from [employer] Sit to Stand: Min assist Stand pivot transfers: Min assist       General transfer comment: verbal cues for UE and LE positioning         ADL Overall ADL's : Needs assistance/impaired     Grooming: Set up;Sitting   Upper Body Bathing: Set up;Sitting   Lower Body Bathing: Moderate assistance;Sit to/from stand   Upper Body Dressing : Set up;Sitting   Lower Body Dressing: Moderate assistance;Sit to/from stand   Toilet Transfer: Minimal assistance;RW                             Pertinent Vitals/Pain Pain Assessment: 0-10 Pain Score: 4  Pain Location: R knee Pain Descriptors / Indicators: Sore;Aching Pain Intervention(s): Limited activity within patient's tolerance;Monitored during session;Premedicated before session;Repositioned;Ice applied     Hand Dominance      Extremity/Trunk Assessment Upper Extremity Assessment Upper Extremity Assessment: Overall WFL for tasks assessed   Lower Extremity Assessment Lower Extremity Assessment: RLE deficits/detail RLE Deficits / Details: unable to perform SLR, maintained KI        Communication Communication Communication: No difficulties   Cognition Arousal/Alertness: Awake/alert Behavior During Therapy: WFL for tasks assessed/performed Overall Cognitive Status: Within Functional Limits for tasks assessed                                Home Living Family/patient expects to be discharged to:: Private residence Living Arrangements: Alone Available Help at Discharge: Family;Available 24 hours/day (daughter) Type of Home: House Home Access: Stairs to enter [employer] of Steps: 1   Home Layout: One level     Bathroom Shower/Tub: Walk-in shower         Home Equipment: Environmental consultant - standard          Prior Functioning/Environment Level of Independence: Independent             OT Diagnosis: Generalized weakness   OT Problem List: Decreased strength   OT Treatment/Interventions: Self-care/ADL training;Patient/family education    OT Goals(Current goals can be found in the care plan section) Acute Rehab OT Goals Patient Stated Goal: home with daughter OT Goal Formulation: With patient Time For Goal Achievement: 07/26/16 Potential to Achieve Goals: Good  OT Frequency: Min 2X/week  End of Session    Activity Tolerance: Patient tolerated treatment well Patient left: in bed   Time: 1225-1244 OT Time Calculation (min): 19 min Charges:  OT General Charges $OT Visit: 1 Procedure OT Evaluation $OT Eval Low Complexity: 1 Procedure G-Codes:    Alba CoryREDDING, Mende Biswell D 07/12/2016, 12:59 PM

## 2016-07-12 NOTE — Progress Notes (Addendum)
Subjective: 1 Day Post-Op Procedure(s) (LRB): RIGHT TOTAL KNEE ARTHROPLASTY (Right) Patient reports pain as moderate.    Objective: Vital signs in last 24 hours: Temp:  [97.4 F (36.3 C)-98.8 F (37.1 C)] 98.3 F (36.8 C) (07/15 1037) Pulse Rate:  [57-78] 57 (07/15 1037) Resp:  [16-18] 18 (07/15 1037) BP: (122-164)/(50-65) 127/50 mmHg (07/15 1037) SpO2:  [96 %-100 %] 99 % (07/15 1037)  Intake/Output from previous day: 07/14 0701 - 07/15 0700 In: 2500 [I.V.:2500] Out: 2250 [Urine:2150; Blood:100] Intake/Output this shift: Total I/O In: -  Out: 200 [Urine:200]   Recent Labs  07/12/16 0453  HGB 10.9*    Recent Labs  07/12/16 0453  WBC 10.5  RBC 3.69*  HCT 33.8*  PLT 137*    Recent Labs  07/12/16 0453  NA 135  K 4.7  CL 105  CO2 24  BUN 20  CREATININE 0.87  GLUCOSE 174*  CALCIUM 9.3   No results for input(s): LABPT, INR in the last 72 hours.  Neurologically intact  Assessment/Plan: 1 Day Post-Op Procedure(s) (LRB): RIGHT TOTAL KNEE ARTHROPLASTY (Right) Up with therapy  Saline lock IV        GLUCOSE A LITTLE HIGH, NO HX OF DM.   WILL PLACE ON CARB MODIFIED DIET AND CHECK A1C.  IF A1C NORMAL SHE CAN GO BACK ON REGULAR DIET.   Brooke Cole C 07/12/2016, 11:30 AM

## 2016-07-12 NOTE — Evaluation (Signed)
Physical Therapy Evaluation Patient Details Name: Brooke Cole MRN: [id number] DOB: 11/07/1946 Today's Date: 07/12/2016   History of Present Illness  Pt is a 70 year old female s/p R TKA   Clinical Impression  Pt is s/p R TKA resulting in the deficits listed below (see PT Problem List).  Pt will benefit from skilled PT to increase their independence and safety with mobility to allow discharge to the venue listed below.  Pt tolerated ambulation well POD #1 and plans to d/c home with assist from her daughter.     Follow Up Recommendations Home health PT;Supervision for mobility/OOB    Equipment Recommendations  None recommended by PT (pt states she has already rented SW, declined RW)    Recommendations for Other Services       Precautions / Restrictions Precautions Precautions: Fall;Knee Required Braces or Orthoses: Knee Immobilizer - Right Restrictions RLE Weight Bearing: Weight bearing as tolerated      Mobility  Bed Mobility Overal bed mobility: Needs Assistance Bed Mobility: Supine to Sit     Supine to sit: Min assist     General bed mobility comments: verbal cues for technique, assist for R LE  Transfers Overall transfer level: Needs assistance Equipment used: Rolling walker (2 wheeled) Transfers: Sit to/from Stand Sit to Stand: Min assist         General transfer comment: verbal cues for UE and LE positioning  Ambulation/Gait Ambulation/Gait assistance: Min guard Ambulation Distance (Feet): 40 Feet Assistive device: Rolling walker (2 wheeled) Gait Pattern/deviations: Step-to pattern;Antalgic     General Gait Details: verbal cues for sequence, step length, RW positioning, posture  Stairs            Wheelchair Mobility    Modified Rankin (Stroke Patients Only)       Balance                                             Pertinent Vitals/Pain Pain Assessment: 0-10 Pain Score: 4  Pain Location: R knee Pain Descriptors  / Indicators: Sore;Aching Pain Intervention(s): Limited activity within patient's tolerance;Monitored during session;Premedicated before session;Repositioned;Ice applied    Home Living Family/patient expects to be discharged to:: Private residence Living Arrangements: Alone Available Help at Discharge: Family;Available 24 hours/day (daughter) Type of Home: House Home Access: Stairs to enter   [employer] Corporation[employer]trance Stairs-Number of Steps: 1 Home Layout: One level Home Equipment: Environmental consultantWalker - standard      Prior Function Level of Independence: Independent               Hand Dominance        Extremity/Trunk Assessment               Lower Extremity Assessment: RLE deficits/detail RLE Deficits / Details: unable to perform SLR, maintained KI        Communication   Communication: No difficulties  Cognition Arousal/Alertness: Awake/alert Behavior During Therapy: WFL for tasks assessed/performed Overall Cognitive Status: Within Functional Limits for tasks assessed                      General Comments      Exercises        Assessment/Plan    PT Assessment Patient needs continued PT services  PT Diagnosis Difficulty walking;Acute pain   PT Problem List Decreased strength;Decreased range of motion;Decreased mobility;Pain;Decreased knowledge of use of  DME;Decreased knowledge of precautions  PT Treatment Interventions DME instruction;Gait training;Stair training;Functional mobility training;Patient/family education;Therapeutic activities;Therapeutic exercise   PT Goals (Current goals can be found in the Care Plan section) Acute Rehab PT Goals PT Goal Formulation: With patient Time For Goal Achievement: 07/15/16 Potential to Achieve Goals: Good    Frequency 7X/week   Barriers to discharge        Co-evaluation               End of Session Equipment Utilized During Treatment: Gait belt;Right knee immobilizer Activity Tolerance: Patient tolerated treatment  well Patient left: in chair;with call bell/phone within reach;with family/visitor present;with chair alarm set Nurse Communication: Mobility status         Time: 9604-5409 PT Time Calculation (min) (ACUTE ONLY): 22 min   Charges:   PT Evaluation $PT Eval Low Complexity: 1 Procedure     PT G Codes:        Abrahm Mancia,KATHrine E 07/12/2016, 11:43 AM Zenovia Jarred, PT, DPT 07/12/2016 Pager: [phone]

## 2016-07-12 NOTE — Progress Notes (Signed)
Physical Therapy Treatment Note    07/12/16 1359  PT Visit Information  Last PT Received On 07/12/16  Assistance Needed +1  History of Present Illness Pt is a [age] year old female s/p R TKA   Subjective Data  Subjective Pt ambulated again in hallway and performed LE exercises.  Precautions  Precautions Fall;Knee  Required Braces or Orthoses Knee Immobilizer - Right  Restrictions  RLE Weight Bearing WBAT  Pain Assessment  Pain Assessment 0-10  Pain Score 3  Pain Location R knee  Pain Descriptors / Indicators Aching;Sore  Pain Intervention(s) Limited activity within patient's tolerance;Monitored during session;Premedicated before session;Repositioned  Cognition  Arousal/Alertness Awake/alert  Behavior During Therapy WFL for tasks assessed/performed  Overall Cognitive Status Within Functional Limits for tasks assessed  Bed Mobility  Overal bed mobility Needs Assistance  Bed Mobility Sit to Supine  Sit to supine Min assist  General bed mobility comments assist for R LE  Transfers  Overall transfer level Needs assistance  Equipment used Rolling walker (2 wheeled)  Transfers Sit to/from Stand  Sit to Stand Min guard  General transfer comment verbal cues for UE and LE positioning  Ambulation/Gait  Ambulation/Gait assistance Min guard  Ambulation Distance (Feet) 80 Feet  Assistive device Rolling walker (2 wheeled)  Gait Pattern/deviations Step-to pattern;Antalgic;Decreased stance time - right  General Gait Details verbal cues for sequence, step length, RW positioning, posture  Gait velocity decr  Exercises  Exercises Total Joint  Total Joint Exercises  Ankle Circles/Pumps AROM;Both;10 reps  Quad Sets AROM;Both;10 reps  Short Arc Quad AAROM;10 reps;Right  Heel Slides AAROM;Right;10 reps  Hip ABduction/ADduction AAROM;Right;10 reps  Straight Leg Raises AAROM;Right;10 reps  Goniometric ROM approx 34* AAROM with heel slides  PT - End of Session  Equipment Utilized During  Treatment Right knee immobilizer  Activity Tolerance Patient tolerated treatment well  Patient left in bed;with call bell/phone within reach;with family/visitor present;with bed alarm set  PT - Assessment/Plan  PT Plan Current plan remains appropriate  PT Frequency (ACUTE ONLY) 7X/week  Follow Up Recommendations Home health PT;Supervision for mobility/OOB  PT equipment Rolling walker with 5" wheels (pt decided she would like RW)  PT Goal Progression  Progress towards PT goals Progressing toward goals  PT Time Calculation  PT Start Time (ACUTE ONLY) 1309  PT Stop Time (ACUTE ONLY) 1333  PT Time Calculation (min) (ACUTE ONLY) 24 min  PT General Charges  $$ ACUTE PT VISIT 1 Procedure  PT Treatments  $Gait Training 8-22 mins  $Therapeutic Exercise 8-22 mins   Zenovia JarredKati Armenia Silveria, PT, DPT 07/12/2016 Pager: [phone]

## 2016-07-12 NOTE — Op Note (Signed)
NAMMarland Kitchen:  Brooke Cole, Brooke Cole               ACCOUNT NO.:  [id number]  MEDICAL RECORD NO.:  [id number]  LOCATION:  1419                         [facility]  PHYSICIAN:  Vanita Pandahristopher Y. 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  PROCEDURE:  Right total knee arthroplasty.  IMPLANTS:  [employer]ker Triathlon knee with size 3 femur, size 3 tibial tray, 11-mm fix-bearing polyethylene insert, size 29 patellar button.  SURGEON:  Vanita PandaChristopher Y. Magnus IvanBlackman, M.D.  ASSISTANT:  Richardean CanalGilbert Clark, PA-C.  ANESTHESIA:  Spinal.  TOURNIQUET TIME:  Less than 1 hour.  BLOOD LOSS:  100 mL.  COMPLICATIONS:  None.  INDICATIONS:  Brooke Cole is a 70 year old female, who has had debilitating arthritis involving her right knee.  She has tried and failed all forms of conservative treatment.  Her pain is daily.  She gets a lot of swelling in her knee with locking and catching.  Her x- rays do show moderate-to-severe arthritis of her right knee and arthroscopic intervention is not warranted at this point, given the extent of her arthritis.  This is determinately affected her activities of daily living, quality of life and her mobility.  At this point, she does wish to proceed with a total knee arthroplasty.  She understands the risk of acute blood loss anemia, nerve and vessel injury, fracture, infection, and DVT.  She understands our goals are decreased pain, improved mobility, and overall improved quality of life.  PROCEDURE DESCRIPTION:  After informed consent was obtained, appropriate right knee was marked.  She was brought to the operating room where spinal anesthesia was obtained while she was on the OR table.  She was laid  in a supine position on the OR table.  A Foley catheter was placed and a nonsterile tourniquet was placed around her upper right thigh. Her right leg was then prepped and draped with DuraPrep and sterile drapes from the thigh down the ankle with a sterile stockinette in place as well.  A time-out was called, she was identified as correct patient and correct right knee.  We then used an Esmarch to wrap out the leg and tourniquet was inflated to 300 mm of pressure.  We then made a midline incision over the patella and carried this proximally and distally.  We dissected down the knee joint and carried out a medial parapatellar arthrotomy, finding a very large joint effusion and significant synovitis of the knee.  We found significant cartilage loss throughout the knee.  We removed remnants of the ACL, PCL, medial and lateral meniscus and then with the knee in a flexed position using the extramedullary cutting guide for the tibia, we corrected for varus and valgus and slept our cut to take 9 mm off the high side.  We corrected for neutral slope as well.  We made a tibial cut without difficulty.  We then went to the femur and set our distal femoral cutting guide of 10 based on just a  slight flexion contracture of her knee.  We set this at 5 degrees externally rotated for right knee and then made our distal femoral cut without difficulty.  We removed further debris from the knee and then brought the knee down in extension with a 9-mm extension block. She had full extension and actually just its touch of hyperextension. We then went back to the femur and put our femoral sizing guide based off the epicondylar axis in 3 degrees right and chose a size 3 femur. We put our 4-in-1 cutting block for 3 femur.  We made our anterior and posterior cuts followed by our chamfer cuts.  We then made our femoral box cut.  We then went to the tibia and set our tibial rotation off the femur and the tibial tubercle  and chose a size 3 tibial tray that covered the tibia plateau.  We made our keel cut off this.  With the trial components in place, with the 3 femur, 3 tibia and 11-mm polyethylene insert, we achieved our full extension.  I was pleased with the flexion as well as stability of that knee.  We then made our patellar cut and drilled 3 holes for size 29 patellar button.  We then removed all trial components from the knee and irrigated the knee with normal saline solution using pulsatile lavage.  We mixed our cement and then cemented the real [employer]ker Triathlon tibial tray size 3, followed by the real size 3 femur.  We placed the real 11-mm fix-bearing polyethylene insert and cleaned the cement debris from the knee and then cemented the patellar button.  Once the cement had hardened, we let the tourniquet down.  Hemostasis was obtained with electrocautery.  We then irrigated the knee again with normal saline solution and closed the arthrotomy with interrupted #1 Vicryl suture followed by 0 Vicryl in the deep tissue, 2-0 Vicryl in the subcutaneous tissue and 4-0 Monocryl subcuticular stitch.  Steri-Strips were applied on the skin as well as well-padded sterile dressing.  She was then taken to the recovery room in stable condition.  All final counts were correct.  There were no complications noted other than some runs of SVT, which has been common for after surgery, so one of our recommendations postop would just be to watch in a telemetry bed.  Of note, Richardean Canal, PA-C assisted in the entire case.  His assistance was crucial for facilitating all aspects of this case.     Vanita Panda. Magnus Ivan, M.D.     CYB/MEDQ  D:  07/11/2016  T:  07/12/2016  Job:  [id number]

## 2016-07-13 MED ORDER — PROMETHAZINE HCL 25 MG PO TABS
25.0000 mg | ORAL_TABLET | Freq: Three times a day (TID) | ORAL | Status: DC | PRN
  Administered 2016-07-13 – 2016-07-14 (×2): 25 mg via ORAL
  Filled 2016-07-13 (×2): qty 1

## 2016-07-13 NOTE — Progress Notes (Signed)
Subjective: 2 Days Post-Op Procedure(s) (LRB): RIGHT TOTAL KNEE ARTHROPLASTY (Right) Patient reports pain as moderate.    Objective: Vital signs in last 24 hours: Temp:  [98.1 F (36.7 C)-98.7 F (37.1 C)] 98.7 F (37.1 C) (07/16 0357) Pulse Rate:  [55-88] 88 (07/16 0859) Resp:  [16-20] 16 (07/16 0859) BP: (126-156)/(50-90) 138/56 mmHg (07/16 0357) SpO2:  [94 %-99 %] 94 % (07/16 0859)  Intake/Output from previous day: 07/15 0701 - 07/16 0700 In: -  Out: 1825 [Urine:1825] Intake/Output this shift:     Recent Labs  07/12/16 0453  HGB 10.9*    Recent Labs  07/12/16 0453  WBC 10.5  RBC 3.69*  HCT 33.8*  PLT 137*    Recent Labs  07/12/16 0453  NA 135  K 4.7  CL 105  CO2 24  BUN 20  CREATININE 0.87  GLUCOSE 174*  CALCIUM 9.3   No results for input(s): LABPT, INR in the last 72 hours.  Neurologically intact  Assessment/Plan: 2 Days Post-Op Procedure(s) (LRB): RIGHT TOTAL KNEE ARTHROPLASTY (Right) Up with therapy   Has been very Nauseated.     Brooke Cole C 07/13/2016, 9:58 AM

## 2016-07-13 NOTE — Progress Notes (Signed)
OT Cancellation Note  Patient Details Name: Brooke Cole MRN: [id number] DOB: 12/23/1946   Cancelled Treatment:    Reason Eval/Treat Not Completed: Other (comment) Pt in bed not feeling well headache and nausea. Will check on pt next day. Lise AuerLori Jamaury Gumz, [place]OT [phone]  Einar CrowEDDING, [name] 07/13/2016, 12:11 PM

## 2016-07-13 NOTE — Progress Notes (Signed)
Physical Therapy Treatment Patient Details Name: Brooke QuinVivian S Cole MRN: [id number] DOB: 06/07/1946 Today's Date: 07/13/2016    History of Present Illness Pt is a 70 year old female s/p R TKA     PT Comments    Pt c/o nausea and HA on this morning. She was agreeable to bed level exercises. Pt politely declined ambulation at this time. Will check back later today and progress activity as tolerated.   Follow Up Recommendations  Home health PT;Supervision for mobility/OOB     Equipment Recommendations  Rolling walker with 5" wheels    Recommendations for Other Services       Precautions / Restrictions Precautions Precautions: Knee;Fall Required Braces or Orthoses: Knee Immobilizer - Right Restrictions Weight Bearing Restrictions: No RLE Weight Bearing: Weight bearing as tolerated    Mobility  Bed Mobility               General bed mobility comments: NT-pt c/o nausea and HA-politely declined OOB at this time.   Transfers                    Ambulation/Gait                 Stairs            Wheelchair Mobility    Modified Rankin (Stroke Patients Only)       Balance                                    Cognition Arousal/Alertness: Awake/alert Behavior During Therapy: WFL for tasks assessed/performed Overall Cognitive Status: Within Functional Limits for tasks assessed                      Exercises Total Joint Exercises Ankle Circles/Pumps: AROM;Both;10 reps Quad Sets: AROM;Both;10 reps Heel Slides: AAROM;Right;10 reps Hip ABduction/ADduction: AAROM;Right;10 reps Straight Leg Raises: AAROM;Right;10 reps Goniometric ROM: ~10-45 degrees    General Comments        Pertinent Vitals/Pain Pain Assessment: Faces Faces Pain Scale: Hurts even more Pain Location: R knee Pain Descriptors / Indicators: Aching;Sore Pain Intervention(s): Limited activity within patient's tolerance;Ice applied    Home Living                       Prior Function            PT Goals (current goals can now be found in the care plan section) Progress towards PT goals: Progressing toward goals    Frequency  7X/week    PT Plan Current plan remains appropriate    Co-evaluation             End of Session   Activity Tolerance:  (Limited by nausea) Patient left: in bed;with call bell/phone within reach;with bed alarm set;with family/visitor present     Time: [id number] PT Time Calculation (min) (ACUTE ONLY): 16 min  Charges:  $Therapeutic Exercise: 8-22 mins                    G Codes:      Rebeca AlertJannie Kordel Leavy, MPT Pager: [phone]

## 2016-07-13 NOTE — Care Management Note (Signed)
Case Management Note  Patient Details  Name: Brooke Cole MRN: [id number] Date of Birth: 07/20/1946  Subjective/Objective:                  s/p R TKA   Action/Plan: CM spoke with patient. [facility] Patient needs [facility] 3N1 and RW[facility]   Expected Discharge Date:                  Expected Discharge Plan:  Home w Home Health Services  In-House Referral:     Discharge planning Services  CM Consult  Post Acute Care Choice:  Home Health Choice offered to:  Patient  DME Arranged:  3-N-1, Walker rolling DME Agency:  [facility]  [facility]  Status of Service:  Completed, signed off  If discussed at Long Length of Stay Meetings, dates discussed:    Additional Comments:  [name], [name], RN 07/13/2016, 2:53 PM

## 2016-07-13 NOTE — Progress Notes (Signed)
Physical Therapy Treatment Patient Details Name: Brooke Cole MRN: [id number] DOB: 06/15/1946 Today's Date: 07/13/2016    History of Present Illness Pt is a 70 year old female s/p R TKA     PT Comments    Pt continuing to have difficulty with nausea. She was able to ambulate with PT this afternoon. Distance limited by nausea, fatigue. Will continue to follow and progress activity as tolerated.   Follow Up Recommendations  Home health PT;Supervision - Intermittent     Equipment Recommendations  Rolling walker with 5" wheels    Recommendations for Other Services       Precautions / Restrictions Precautions Precautions: Fall;Knee Required Braces or Orthoses: Knee Immobilizer - Right Restrictions Weight Bearing Restrictions: No RLE Weight Bearing: Weight bearing as tolerated    Mobility  Bed Mobility Overal bed mobility: Needs Assistance Bed Mobility: Supine to Sit;Sit to Supine     Supine to sit: Min assist Sit to supine: Min assist   General bed mobility comments: Assist for R LE  Transfers Overall transfer level: Needs assistance Equipment used: Rolling walker (2 wheeled) Transfers: Sit to/from Stand Sit to Stand: Min assist         General transfer comment: Assist to rise, stabilize, control descent. VCs hand placement  Ambulation/Gait Ambulation/Gait assistance: Min assist Ambulation Distance (Feet): 85 Feet Assistive device: Rolling walker (2 wheeled) Gait Pattern/deviations: Step-to pattern;Decreased stance time - right;Antalgic     General Gait Details: assist to stabilize intermittently. brief standing rest breaks needed due to nausea. slow gait speed.    Stairs            Wheelchair Mobility    Modified Rankin (Stroke Patients Only)       Balance Overall balance assessment: Needs assistance         Standing balance support: During functional activity Standing balance-Leahy Scale: Poor                      Cognition  Arousal/Alertness: Awake/alert Behavior During Therapy: WFL for tasks assessed/performed Overall Cognitive Status: Within Functional Limits for tasks assessed                      Exercises      General Comments        Pertinent Vitals/Pain Pain Assessment: Faces Faces Pain Scale: Hurts little more Pain Location: R knee Pain Descriptors / Indicators: Aching;Sore Pain Intervention(s): Monitored during session;Ice applied;Repositioned    Home Living                      Prior Function            PT Goals (current goals can now be found in the care plan section) Progress towards PT goals: Progressing toward goals (slowly)    Frequency  7X/week    PT Plan Current plan remains appropriate    Co-evaluation             End of Session Equipment Utilized During Treatment: Right knee immobilizer Activity Tolerance:  (limited by nausea) Patient left: in bed;with call bell/phone within reach;with bed alarm set;with family/visitor present     Time: 4098-11911326-1354 PT Time Calculation (min) (ACUTE ONLY): 28 min  Charges:  $Gait Training: 23-37 mins                    G Codes:      Rebeca AlertJannie Elle Vezina, MPT Pager: [phone]

## 2016-07-14 LAB — HEMOGLOBIN A1C
Hgb A1c MFr Bld: 6.4 % — ABNORMAL HIGH (ref 4.8–5.6)
MEAN PLASMA GLUCOSE: 137 mg/dL

## 2016-07-14 MED ORDER — ASPIRIN 325 MG PO TBEC: 325.0000 mg | DELAYED_RELEASE_TABLET | Freq: Two times a day (BID) | ORAL | Status: DC

## 2016-07-14 MED ORDER — PROMETHAZINE HCL 12.5 MG PO TABS: 12.5000 mg | ORAL_TABLET | Freq: Four times a day (QID) | ORAL | Status: DC | PRN

## 2016-07-14 MED ORDER — OXYCODONE-ACETAMINOPHEN 5-325 MG PO TABS: 1.0000 | ORAL_TABLET | ORAL | Status: DC | PRN

## 2016-07-14 NOTE — Progress Notes (Signed)
Occupational Therapy Treatment Patient Details Name: Brooke Cole MRN: [id number] DOB: 08/13/1946 Today's Date: 07/14/2016    History of present illness Pt is a 70 year old female s/p R TKA    OT comments  Ready for DC  Follow Up Recommendations  Home health OT;Supervision - Intermittent    Equipment Recommendations  3 in 1 bedside comode    Recommendations for Other Services      Precautions / Restrictions Precautions Precautions: Fall;Knee Required Braces or Orthoses:  (Pt stated MD said ok not to wear KI) Restrictions Weight Bearing Restrictions: Yes RLE Weight Bearing: Weight bearing as tolerated       Mobility Bed Mobility Overal bed mobility: Needs Assistance Bed Mobility: Supine to Sit     Supine to sit: Min guard     General bed mobility comments: pt in chair  Transfers Overall transfer level: Needs assistance Equipment used: Rolling walker (2 wheeled) Transfers: Sit to/from UGI CorporationStand;Stand Pivot Transfers Sit to Stand: Supervision Stand pivot transfers: Supervision       General transfer comment: close guard for safety. VCs safety, technique, hand/LE placement    Balance                                   ADL                       Lower Body Dressing: Min guard;Sit to/from stand;Cueing for sequencing;Cueing for compensatory techniques   Toilet Transfer: Supervision/safety;RW;Comfort height toilet   Toileting- Clothing Manipulation and Hygiene: Sit to/from stand       Functional mobility during ADLs: Supervision/safety                  Cognition   Behavior During Therapy: [facility] LLCWFL for tasks assessed/performed Overall Cognitive Status: Within Functional Limits for tasks assessed                                    Pertinent Vitals/ Pain       Pain Assessment: 0-10 Pain Score: 7  Pain Location: R knee with activity Pain Intervention(s): Monitored during session;Ice applied;Repositioned          Frequency Min 2X/week     Progress Toward Goals  OT Goals(current goals can now be found in the care plan section)  Progress towards OT goals: Progressing toward goals     Plan         End of Session     Activity Tolerance Patient tolerated treatment well   Patient Left in bed   Nurse Communication          Time: 8295-62131025-1038 OT Time Calculation (min): 13 min  Charges: OT General Charges $OT Visit: 1 Procedure OT Treatments $Self Care/Home Management : 8-22 mins  Brooke Cole D 07/14/2016, 10:46 AM

## 2016-07-14 NOTE — Progress Notes (Signed)
Patient ID: Brooke Cole, female   DOB: 09/21/1946, 70 y.o.   MRN: [id number] Doing well.  Can be discharged to home today.

## 2016-07-14 NOTE — Progress Notes (Signed)
Physical Therapy Treatment Patient Details Name: Brooke Cole MRN: [id number] DOB: 02/05/1946 Today's Date: 07/14/2016    History of Present Illness Pt is a 70 year old female s/p R TKA     PT Comments    Pt feeling better this am. Practiced/reviewed exercises, gait training. Verbally reviewed correct stair technique for 1 step. All education completed. Ready to d/c from PT standpoint.   Follow Up Recommendations  Home health PT;Supervision - Intermittent     Equipment Recommendations  Rolling walker with 5" wheels    Recommendations for Other Services       Precautions / Restrictions Precautions Precautions: Fall;Knee Required Braces or Orthoses:  (Pt stated MD said ok not to wear KI) Restrictions Weight Bearing Restrictions: Yes RLE Weight Bearing: Weight bearing as tolerated    Mobility  Bed Mobility Overal bed mobility: Needs Assistance Bed Mobility: Supine to Sit     Supine to sit: Min guard     General bed mobility comments: close guard for safety  Transfers Overall transfer level: Needs assistance Equipment used: Rolling walker (2 wheeled) Transfers: Sit to/from Stand Sit to Stand: Min guard         General transfer comment: close guard for safety. VCs safety, technique, hand/LE placement  Ambulation/Gait Ambulation/Gait assistance: Min guard Ambulation Distance (Feet): 115 Feet Assistive device: Rolling walker (2 wheeled) Gait Pattern/deviations: Step-to pattern;Step-through pattern;Decreased stride length     General Gait Details: VCs safety, R foot flat instead of on toes, step length. close guard for safety.    Stairs Stairs:  (verbally reviewed correct technique with pt and daughter)          Wheelchair Mobility    Modified Rankin (Stroke Patients Only)       Balance                                    Cognition Arousal/Alertness: Awake/alert Behavior During Therapy: WFL for tasks assessed/performed Overall  Cognitive Status: Within Functional Limits for tasks assessed                      Exercises Total Joint Exercises Ankle Circles/Pumps: AROM;Both;10 reps Quad Sets: AROM;Both;10 reps Hip ABduction/ADduction: AAROM;Right;10 reps Straight Leg Raises: AAROM;Right;10 reps Long Arc Quad: AROM;Right;Seated (limited ROM) Knee Flexion: AAROM;Right;10 reps;Seated Goniometric ROM: ~10-60 degrees    General Comments        Pertinent Vitals/Pain Pain Assessment: 0-10 Pain Score: 7  Pain Location: R knee with activity Pain Intervention(s): Monitored during session;Ice applied;Repositioned    Home Living                      Prior Function            PT Goals (current goals can now be found in the care plan section) Progress towards PT goals: Progressing toward goals    Frequency  7X/week    PT Plan Current plan remains appropriate    Co-evaluation             End of Session   Activity Tolerance: Patient tolerated treatment well Patient left: in chair;with call bell/phone within reach;with family/visitor present     Time: [id number] PT Time Calculation (min) (ACUTE ONLY): 26 min  Charges:  $Gait Training: 8-22 mins $Therapeutic Exercise: 8-22 mins  G Codes:      Weston Anna, MPT Pager: [phone]

## 2016-07-14 NOTE — Discharge Instructions (Signed)

## 2016-07-14 NOTE — Care Management Note (Signed)
Case Management Note  Patient Details  Name: Bonnee QuinVivian S Bruschi MRN: [id number] Date of Birth: 08/24/1946  Subjective/Objective:  [facility] already aware of HHPT orders. D/c today home w/[facility]-rep Clydie BraunKaren aware.Patient already has dme-rw,3n1.                  Action/Plan:d/c home w/HHC.   Expected Discharge Date:                  Expected Discharge Plan:  Home w Home Health Services  In-House Referral:     Discharge planning Services  CM Consult  Post Acute Care Choice:  Home Health Choice offered to:  Patient  DME Arranged:  3-N-1, Walker rolling DME Agency:  [facility]  [facility]  Status of Service:  Completed, signed off  If discussed at Long Length of Stay Meetings, dates discussed:    Additional Comments:  Lanier ClamMahabir, Monaye Blackie, RN 07/14/2016, 10:34 AM

## 2016-07-14 NOTE — Discharge Summary (Signed)
Patient ID: Brooke Cole Caswell MRN: [id number] DOB/AGE: 71/06/1946 71 y.o.  Admit date: 07/11/2016 Discharge date: 07/14/2016  Admission Diagnoses:  Principal Problem:   Osteoarthritis of right knee Active Problems:   Status post total right knee replacement   Discharge Diagnoses:  Same  Past Medical History  Diagnosis Date  . PONV (postoperative nausea and vomiting)     when had colonscopy 03/2016 had very difficult time awakening; pt states had chest hit due to BP dropping dramatically   . Adverse effect of anesthetic     pt states when had partial hysterectomy while in recovery had code blue called on her due to decreased BP   . Hypertension   . Tinnitus   . Asthma   . History of multiple allergies   . Pneumonia   . History of bronchitis     11/2015 last episode   . GERD (gastroesophageal reflux disease)   . Arthritis     Surgeries: Procedure(Cole): RIGHT TOTAL KNEE ARTHROPLASTY on 07/11/2016   Consultants:    Discharged Condition: Improved  Hospital Course: Brooke Cole Reddish is an 70 y.o. female who was admitted 07/11/2016 for operative treatment ofOsteoarthritis of right knee. Patient has severe unremitting pain that affects sleep, daily activities, and work/hobbies. After pre-op clearance the patient was taken to the operating room on 07/11/2016 and underwent  Procedure(Cole): RIGHT TOTAL KNEE ARTHROPLASTY.    Patient was given perioperative antibiotics: Anti-infectives    Start     Dose/Rate Route Frequency Ordered Stop   07/12/16 1000  doxycycline (VIBRA-TABS) tablet 50 mg     50 mg Oral Every other day 07/11/16 1107     07/11/16 1200  ceFAZolin (ANCEF) IVPB 1 g/50 mL premix     1 g 100 mL/hr over 30 Minutes Intravenous Every 6 hours 07/11/16 1107 07/11/16 1837   07/11/16 0533  ceFAZolin (ANCEF) IVPB 2g/100 mL premix     2 g 200 mL/hr over 30 Minutes Intravenous On call to O.R. 07/11/16 0533 07/11/16 0732       Patient was given sequential compression devices, early  ambulation, and chemoprophylaxis to prevent DVT.  Patient benefited maximally from hospital stay and there were no complications.    Recent vital signs: Patient Vitals for the past 24 hrs:  BP Temp Temp src Pulse Resp SpO2  07/14/16 0335 (!) 156/66 mmHg 98.9 F (37.2 C) Oral 65 20 95 %  07/13/16 2059 (!) 133/52 mmHg 99.2 F (37.3 C) Oral 72 20 92 %  07/13/16 1953 - - - - - 94 %  07/13/16 1424 136/62 mmHg 98.9 F (37.2 C) Oral 63 18 94 %  07/13/16 0859 - - - 88 16 94 %     Recent laboratory studies:  Recent Labs  07/12/16 0453  WBC 10.5  HGB 10.9*  HCT 33.8*  PLT 137*  NA 135  K 4.7  CL 105  CO2 24  BUN 20  CREATININE 0.87  GLUCOSE 174*  CALCIUM 9.3     Discharge Medications:     Medication List    TAKE these medications        aspirin 325 MG EC tablet  Take 1 tablet (325 mg total) by mouth 2 (two) times daily after a meal.     CALCIUM 600 600 MG Tabs tablet  Generic drug:  calcium carbonate  Take 1 tablet by mouth daily.     cetirizine 10 MG tablet  Commonly known as:  ZYRTEC  Take 10 mg by mouth daily.  doxycycline 20 MG tablet  Commonly known as:  PERIOSTAT  Take 20 mg by mouth daily.     DULERA 200-5 MCG/ACT Aero  Generic drug:  mometasone-formoterol  Inhale 2 puffs into the lungs 2 (two) times daily.     EPIPEN 2-PAK 0.3 mg/0.3 mL Soaj injection  Generic drug:  EPINEPHrine  Inject 0.3 mg into the skin once.     fluticasone 50 MCG/ACT nasal spray  Commonly known as:  FLONASE  Place 1 spray into the nose daily.     lisinopril 40 MG tablet  Commonly known as:  PRINIVIL,ZESTRIL  Take 40 mg by mouth daily.     metoprolol 50 MG tablet  Commonly known as:  LOPRESSOR  Take 1 tablet by mouth 2 (two) times daily.     montelukast 10 MG tablet  Commonly known as:  SINGULAIR  Take 1 tablet by mouth daily.     multivitamin tablet  Take 1 tablet by mouth daily.     OMEGA 3 PO  Take 1 capsule by mouth daily.     omeprazole 40 MG capsule   Commonly known as:  PRILOSEC  Take 40 mg by mouth daily.     oxyCODONE-acetaminophen 5-325 MG tablet  Commonly known as:  ROXICET  Take 1-2 tablets by mouth every 4 (four) hours as needed.     PRESCRIPTION MEDICATION  Place 3 drops under the tongue daily. Allergy drops     PROAIR HFA 108 (90 Base) MCG/ACT inhaler  Generic drug:  albuterol  Inhale 2 puffs into the lungs every 4 (four) hours as needed for wheezing or shortness of breath.     promethazine 12.5 MG tablet  Commonly known as:  PHENERGAN  Take 1 tablet (12.5 mg total) by mouth every 6 (six) hours as needed for nausea or vomiting.     XANAX 0.25 MG tablet  Generic drug:  ALPRAZolam  Take 1 tablet by mouth daily as needed.        Diagnostic Studies: Dg Chest 2 View  07/03/2016  CLINICAL DATA:  Preoperative examination prior to knee joint replacement, no current chest complaints ; history of asthma and hypertension EXAM: CHEST  2 VIEW COMPARISON:  Chest x-ray of May 08, 2015 FINDINGS: The lungs are borderline hyperinflated. There is no focal infiltrate. There is no pleural effusion. The heart and pulmonary vascularity are normal. The mediastinum is normal in width. There is faint calcification in the wall of the aortic arch. The bony thorax exhibits no acute abnormality. IMPRESSION: Borderline hyperinflation consistent with reactive airway disease. No acute cardiopulmonary abnormality. Aortic atherosclerosis. Electronically Signed   By: David  [place] M.D.   On: 07/03/2016 11:22   Dg Knee Right Port  07/11/2016  CLINICAL DATA:  Post right knee replacement. EXAM: PORTABLE RIGHT KNEE - 1-2 VIEW COMPARISON:  None. FINDINGS: Changes of right knee replacement. Soft tissue and joint space gas. No hardware or bony complicating feature. IMPRESSION: Right knee replacement.  No complicating feature. Electronically Signed   By: Charlett Nose M.D.   On: 07/11/2016 10:16    Disposition: to home      Discharge Instructions    Call MD /  Call 911    Complete by:  As directed   If you experience chest pain or shortness of breath, CALL 911 and be transported to the hospital emergency room.  If you develope a fever above 101 F, pus (white drainage) or increased drainage or redness at the wound, or calf pain, call  your surgeon'Cole office.     Constipation Prevention    Complete by:  As directed   Drink plenty of fluids.  Prune juice may be helpful.  You may use a stool softener, such as Colace (over the counter) 100 mg twice a day.  Use MiraLax (over the counter) for constipation as needed.     Diet - low sodium heart healthy    Complete by:  As directed      Discharge patient    Complete by:  As directed      Increase activity slowly as tolerated    Complete by:  As directed            Follow-up Information    Follow up with Kathryne Hitch, MD In 2 weeks.   Specialty:  Orthopedic Surgery   Contact information:   [address] [place] [address] [phone]        Signed: Kathryne Hitch 07/14/2016, 7:03 AM

## 2016-07-14 NOTE — Care Management Important Message (Signed)
Important Message  Patient Details  Name: Brooke Cole MRN: [id number] Date of Birth: 06/18/1946   Medicare Important Message Given:  Yes    Haskell FlirtJamison, Hayla Hinger 07/14/2016, 9:13 AMImportant Message  Patient Details  Name: Brooke Cole MRN: [id number] Date of Birth: 03/18/1946   Medicare Important Message Given:  Yes    Haskell FlirtJamison, Particia Strahm 07/14/2016, 9:13 AM

## 2017-01-13 ENCOUNTER — Ambulatory Visit (INDEPENDENT_AMBULATORY_CARE_PROVIDER_SITE_OTHER): Payer: Self-pay | Admitting: Orthopaedic Surgery

## 2017-02-04 ENCOUNTER — Ambulatory Visit (INDEPENDENT_AMBULATORY_CARE_PROVIDER_SITE_OTHER): Payer: Self-pay | Admitting: Orthopaedic Surgery

## 2017-03-18 ENCOUNTER — Ambulatory Visit (INDEPENDENT_AMBULATORY_CARE_PROVIDER_SITE_OTHER): Payer: Medicare Other | Admitting: Orthopaedic Surgery

## 2017-03-18 ENCOUNTER — Encounter (INDEPENDENT_AMBULATORY_CARE_PROVIDER_SITE_OTHER): Payer: Self-pay

## 2017-03-18 ENCOUNTER — Ambulatory Visit (INDEPENDENT_AMBULATORY_CARE_PROVIDER_SITE_OTHER): Payer: Medicare Other

## 2017-03-18 DIAGNOSIS — Z96651 Presence of right artificial knee joint: Secondary | ICD-10-CM | POA: Diagnosis not present

## 2017-03-18 NOTE — Progress Notes (Signed)
The patient is 8 months status post a right total knee replacement. She has no complaints and says she is doing well. She said she like to be to get down on her knees easier but she knows that'll, time. She's had some swelling but really no issues at all. She said she's much better than she was with her knee pain preoperatively.  On examination of her right knee her knee is ligamentous is stable. Her range of motion is full. There is no is infection. Her incisions well-healed. Her calf is soft.  An AP and lateral of the right knee are obtained and show well-seated implant with no, getting features. There is no evidence of loosening.  At this point she'll continue increase her activities as she tolerates. I have really no restrictions for her. At this point she can follow-up as needed. I talked her about the things that we need to bring her back for that knee if she is having any issues.

## 2017-08-08 IMAGING — CR DG CHEST 2V
2 series · 2 of 2 positions shown · non-contrast
Comparison: Chest x-ray of May 08, 2015

CLINICAL DATA: Preoperative examination prior to knee joint
replacement, no current chest complaints ; history of asthma and
hypertension

EXAM:
CHEST  2 VIEW

[w chest pa]
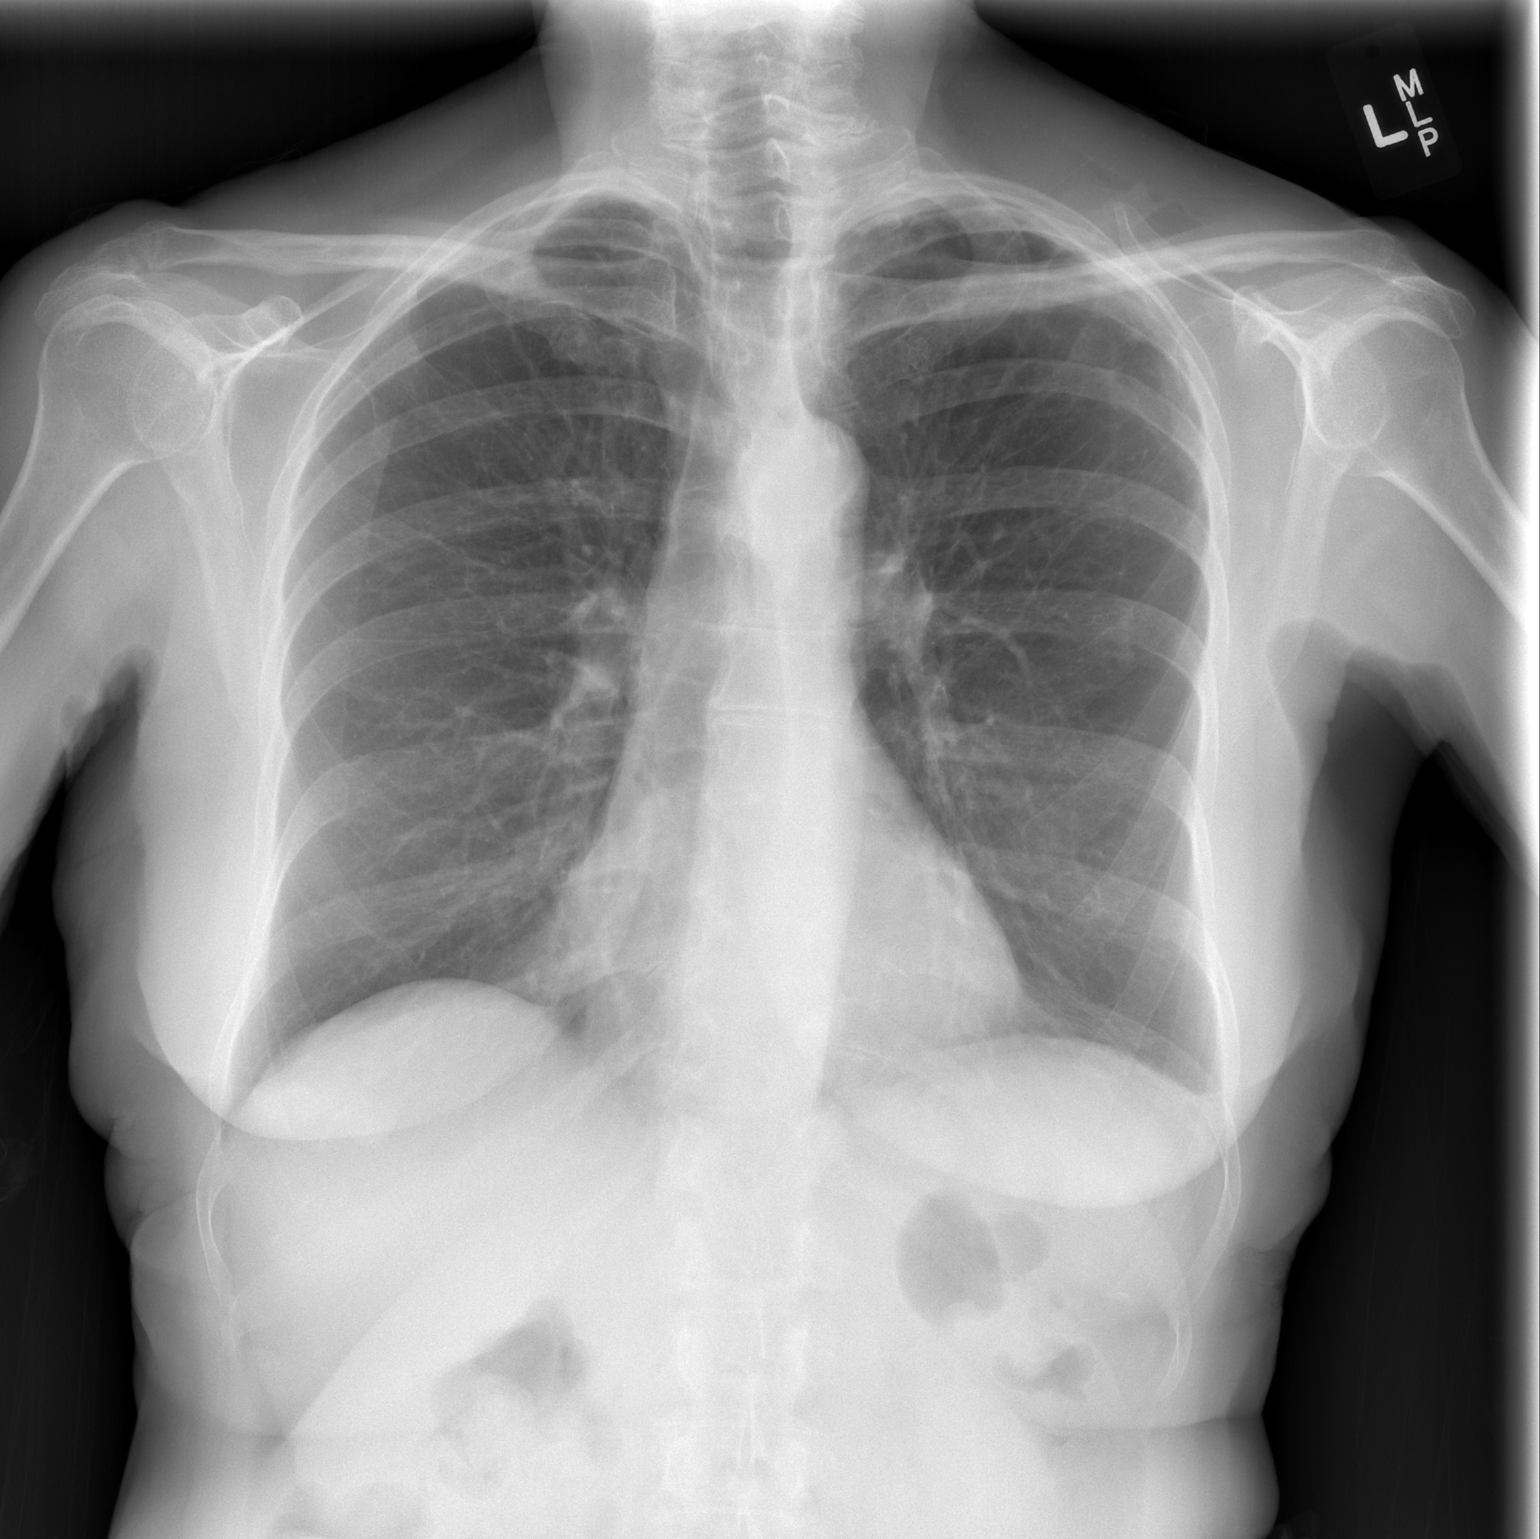

[w chest lat]
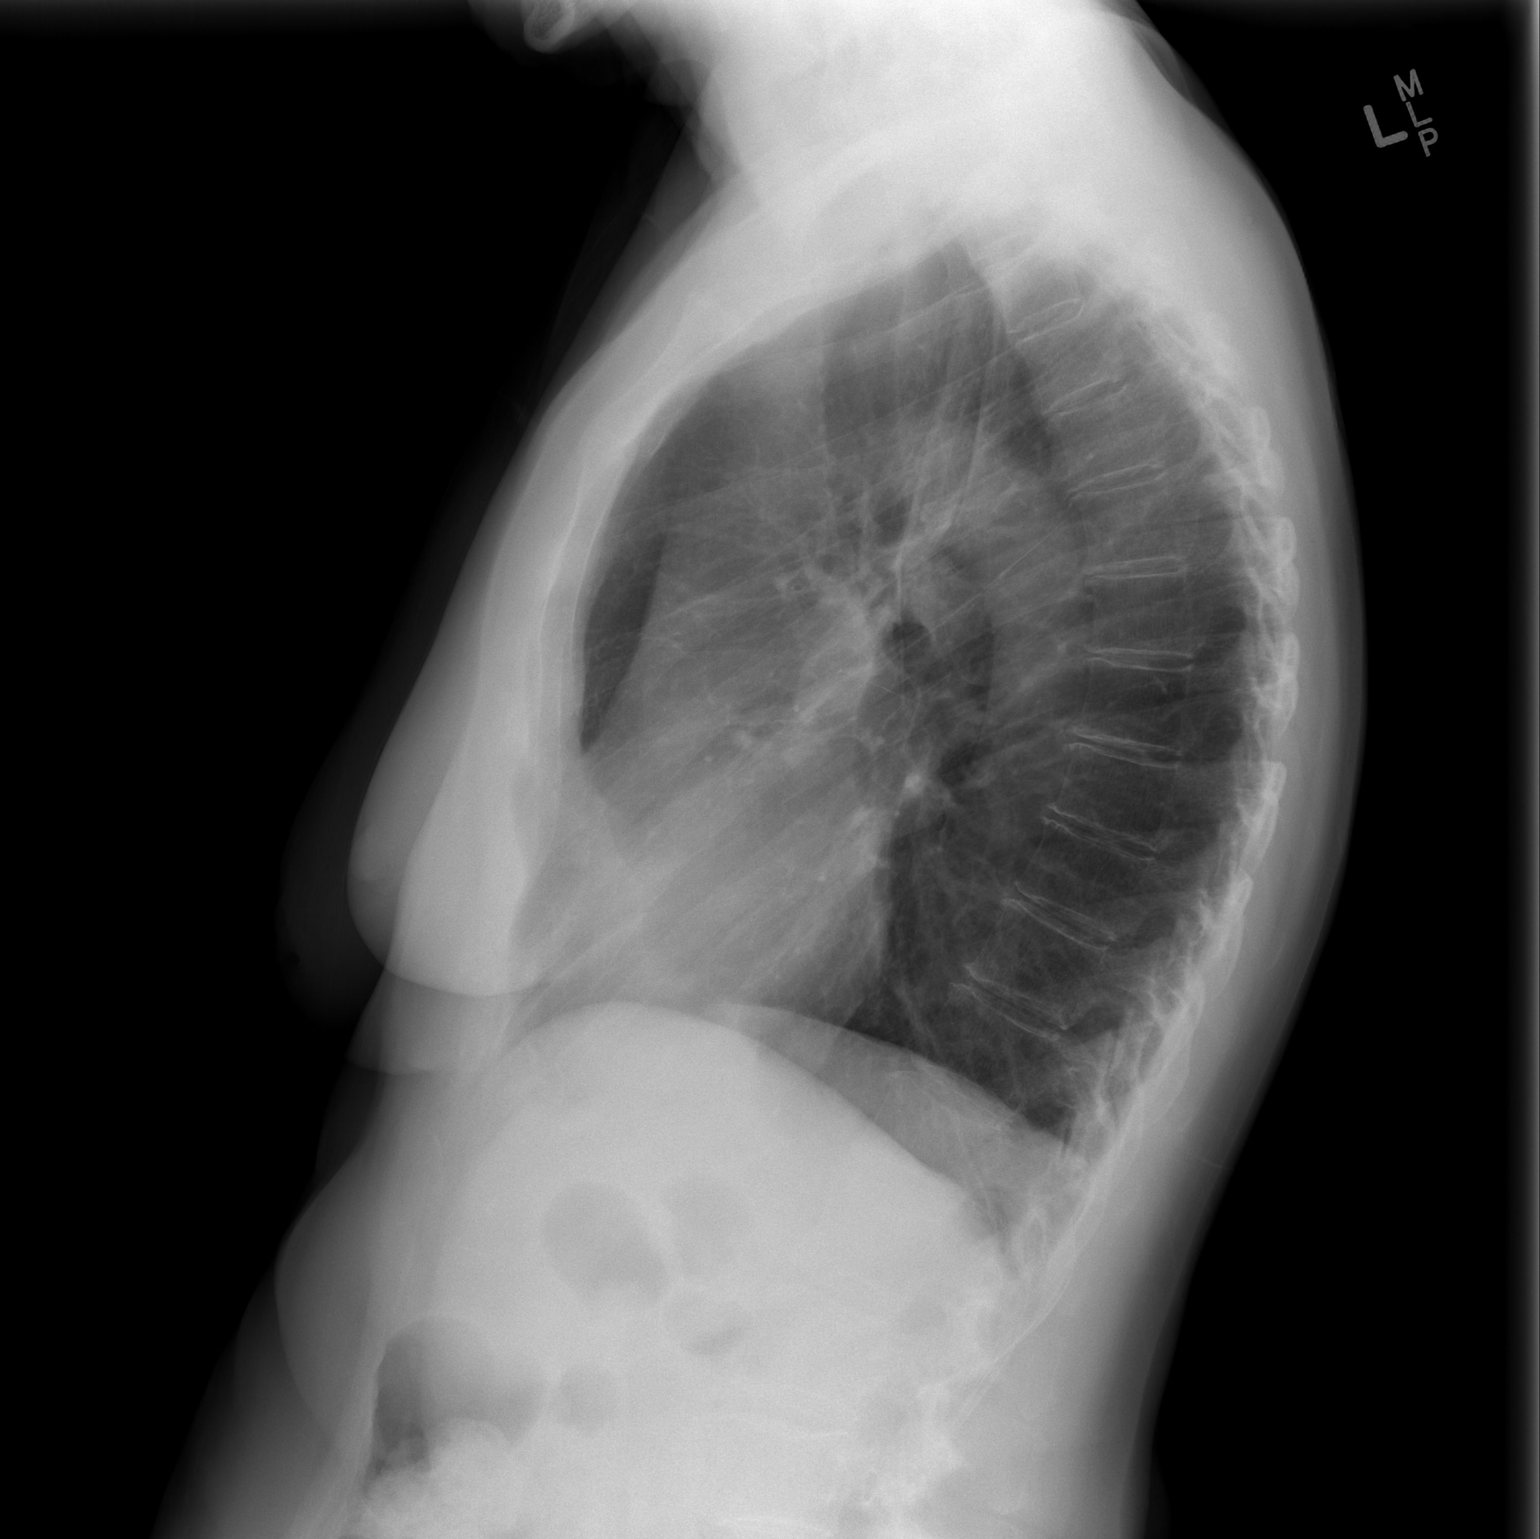

[2 of 2 positions shown; findings below may reference images not displayed]

FINDINGS: The lungs are borderline hyperinflated. There is no focal
infiltrate. There is no pleural effusion. The heart and pulmonary
vascularity are normal. The mediastinum is normal in width. There is
faint calcification in the wall of the aortic arch. The bony thorax
exhibits no acute abnormality.
IMPRESSION: Borderline hyperinflation consistent with reactive airway disease.
No acute cardiopulmonary abnormality.

Aortic atherosclerosis.

## 2020-05-21 ENCOUNTER — Other Ambulatory Visit: Payer: Self-pay

## 2020-05-21 ENCOUNTER — Encounter: Payer: Self-pay | Admitting: Physician Assistant

## 2020-05-21 ENCOUNTER — Ambulatory Visit: Payer: Self-pay

## 2020-05-21 ENCOUNTER — Ambulatory Visit: Payer: Medicare Other | Admitting: Physician Assistant

## 2020-05-21 DIAGNOSIS — M25561 Pain in right knee: Secondary | ICD-10-CM

## 2020-05-21 NOTE — Progress Notes (Signed)
Office Visit Note   Patient: Brooke Cole           Date of Birth: 01/19/1946           MRN: [id number] Visit Date: 05/21/2020              Requested by: Albertina Senegal, MD [address] [place],  [place] [address] PCP: Albertina Senegal, MD   Assessment & Plan: Visit Diagnoses:  1. Acute pain of right knee     Plan: She will try Voltaren gel on her knee 4 times daily.  Follow-up with [place] 4 weeks see what type of response she had.  Questions were encouraged and answered.  I did discuss with her that her knee contusion can take up to 3 months totally heal.   Follow-Up Instructions: Return in about 4 weeks (around 06/18/2020).   Orders:  Orders Placed This Encounter  Procedures  . XR Knee 1-2 Views Right   No orders of the defined types were placed in this encounter.     Procedures: No procedures performed   Clinical Data: No additional findings.   Subjective: Chief Complaint  Patient presents with  . Right Knee - Pain    HPI Brooke Cole comes in today due to right knee pain.  She is status post right total knee arthroplasty 07/11/2016.  Was doing well until she hit her knee on the footboard of her bed a week ago.  She had some bruising.  States at times feet knee feels like it will give way.  Burning.  She tried some Advil which has helped. Review of Systems See HPI  Objective: Vital Signs: There were no vitals taken for this visit.  Physical Exam General: Well-developed well-nourished female no acute distress Psych: Alert and oriented x3 Ortho Exam Right knee surgical incisions well-healed she has an area of ecchymosis over the pes anserinus area and some tenderness.  No instability valgus varus stressing.  No abnormal warmth erythema or effusion.  Good range of motion of the knee otherwise. Specialty Comments:  No specialty comments available.  Imaging: No results found.   PMFS History: Patient Active Problem List   Diagnosis Date Noted  .  Osteoarthritis of right knee 07/11/2016  . Status post total right knee replacement 07/11/2016   Past Medical History:  Diagnosis Date  . Adverse effect of anesthetic    pt states when had partial hysterectomy while in recovery had code blue called on her due to decreased BP   . Arthritis   . Asthma   . GERD (gastroesophageal reflux disease)   . History of bronchitis    11/2015 last episode   . History of multiple allergies   . Hypertension   . Pneumonia   . PONV (postoperative nausea and vomiting)    when had colonscopy 03/2016 had very difficult time awakening; pt states had chest hit due to BP dropping dramatically   . Tinnitus     History reviewed. No pertinent family history.  Past Surgical History:  Procedure Laterality Date  . ABDOMINAL HYSTERECTOMY     partial 2001  . broken hand - 3 screws     2013  . EYE SURGERY     cataract removal bilat with implants  . TMJ bilateral meniscectomy     1989  . TONSILLECTOMY     1952  . TOTAL KNEE ARTHROPLASTY Right 07/11/2016   Procedure: RIGHT TOTAL KNEE ARTHROPLASTY;  Surgeon: Kathryne Hitch, MD;  Location: [facility];  Service: Orthopedics;  Laterality: Right;   Social History   Occupational History  . Not on file  Tobacco Use  . Smoking status: Never Smoker  . Smokeless tobacco: Never Used  Substance and Sexual Activity  . Alcohol use: No  . Drug use: No  . Sexual activity: Not on file

## 2020-11-01 ENCOUNTER — Other Ambulatory Visit: Payer: Self-pay | Admitting: Orthopedic Surgery

## 2020-11-01 DIAGNOSIS — L608 Other nail disorders: Secondary | ICD-10-CM

## 2020-11-21 ENCOUNTER — Ambulatory Visit
Admission: RE | Admit: 2020-11-21 | Discharge: 2020-11-21 | Disposition: A | Discharge status: Home / Self Care | Payer: Medicare Other | Source: Ambulatory Visit | Attending: Orthopedic Surgery | Admitting: Orthopedic Surgery

## 2020-11-21 ENCOUNTER — Other Ambulatory Visit: Payer: Self-pay

## 2020-11-21 DIAGNOSIS — L608 Other nail disorders: Secondary | ICD-10-CM

## 2021-07-16 DIAGNOSIS — R7303 Prediabetes: Secondary | ICD-10-CM | POA: Insufficient documentation

## 2023-03-30 ENCOUNTER — Emergency Department (HOSPITAL_BASED_OUTPATIENT_CLINIC_OR_DEPARTMENT_OTHER)
Admission: EM | Admit: 2023-03-30 | Discharge: 2023-03-30 | Disposition: A | Discharge status: Home / Self Care | Payer: Medicare Other | Attending: Emergency Medicine | Admitting: Emergency Medicine

## 2023-03-30 ENCOUNTER — Emergency Department (HOSPITAL_BASED_OUTPATIENT_CLINIC_OR_DEPARTMENT_OTHER): Payer: Medicare Other

## 2023-03-30 ENCOUNTER — Other Ambulatory Visit: Payer: Self-pay

## 2023-03-30 ENCOUNTER — Encounter (HOSPITAL_BASED_OUTPATIENT_CLINIC_OR_DEPARTMENT_OTHER): Payer: Self-pay

## 2023-03-30 DIAGNOSIS — Z79899 Other long term (current) drug therapy: Secondary | ICD-10-CM | POA: Diagnosis not present

## 2023-03-30 DIAGNOSIS — M25562 Pain in left knee: Secondary | ICD-10-CM | POA: Diagnosis present

## 2023-03-30 DIAGNOSIS — J45909 Unspecified asthma, uncomplicated: Secondary | ICD-10-CM | POA: Diagnosis not present

## 2023-03-30 DIAGNOSIS — I10 Essential (primary) hypertension: Secondary | ICD-10-CM | POA: Diagnosis not present

## 2023-03-30 DIAGNOSIS — X501XXA Overexertion from prolonged static or awkward postures, initial encounter: Secondary | ICD-10-CM | POA: Insufficient documentation

## 2023-03-30 DIAGNOSIS — Z7951 Long term (current) use of inhaled steroids: Secondary | ICD-10-CM | POA: Diagnosis not present

## 2023-03-30 DIAGNOSIS — Z96651 Presence of right artificial knee joint: Secondary | ICD-10-CM | POA: Diagnosis not present

## 2023-03-30 DIAGNOSIS — Y9301 Activity, walking, marching and hiking: Secondary | ICD-10-CM | POA: Diagnosis not present

## 2023-03-30 DIAGNOSIS — E785 Hyperlipidemia, unspecified: Secondary | ICD-10-CM | POA: Insufficient documentation

## 2023-03-30 MED ORDER — ACETAMINOPHEN 500 MG PO TABS
1000.0000 mg | ORAL_TABLET | Freq: Once | ORAL | Status: AC
  Administered 2023-03-30: 1000 mg via ORAL
  Filled 2023-03-30: qty 2

## 2023-03-30 NOTE — ED Provider Notes (Signed)
[facility] Provider Note   CSN: [id number] Arrival date & time: 03/30/23  3474     History  Chief Complaint  Patient presents with   Knee Pain    Brooke Cole is a 77 y.o. female with reflux, HTN, HLD, asthma, prediabetes, osteoarthritis, s/p total R knee replacement who presents with knee pain.  Patient reports being prescribed levaquin recently for a sinus infection and was told that there was a chance for tendon rupture with it. Patient states that earlier today around 2 pm she was walking and felt a pop in left knee with moderate pain. Unable to bear weight on left leg currently without severe pain. Sensation/pulses intact distally, she hasn't had no fever/chills. Otherwise in her normal state of health.    Knee Pain      Home Medications Prior to Admission medications   Medication Sig Start Date End Date Taking? Authorizing Provider  albuterol (PROAIR HFA) 108 (90 Base) MCG/ACT inhaler Inhale 2 puffs into the lungs every 4 (four) hours as needed for wheezing or shortness of breath.  04/13/15   [provider]  ALPRAZolam Prudy Feeler) 0.25 MG tablet Take 1 tablet by mouth daily as needed. 04/30/16   [provider]  calcium carbonate (CALCIUM 600) 600 MG TABS tablet Take 1 tablet by mouth daily.    [provider]  cetirizine (ZYRTEC) 10 MG tablet Take 10 mg by mouth daily.    [provider]  doxycycline (PERIOSTAT) 20 MG tablet Take 20 mg by mouth daily. 01/23/16   [provider]  EPINEPHrine (EPIPEN 2-PAK) 0.3 mg/0.3 mL IJ SOAJ injection Inject 0.3 mg into the skin once.  01/03/16   [provider]  fluticasone (FLONASE) 50 MCG/ACT nasal spray Place 1 spray into the nose daily.    [provider]  lisinopril (PRINIVIL,ZESTRIL) 40 MG tablet Take 40 mg by mouth daily. 05/02/16   [provider]  metoprolol (LOPRESSOR) 50 MG tablet Take 1 tablet by mouth 2 (two) times  daily.  05/21/16   [provider]  mometasone-formoterol (DULERA) 200-5 MCG/ACT AERO Inhale 2 puffs into the lungs 2 (two) times daily. 01/09/16   [provider]  montelukast (SINGULAIR) 10 MG tablet Take 1 tablet by mouth daily. 05/12/16   [provider]  Multiple Vitamin (MULTIVITAMIN) tablet Take 1 tablet by mouth daily.    [provider]  Omega-3 Fatty Acids (OMEGA 3 PO) Take 1 capsule by mouth daily.    [provider]  omeprazole (PRILOSEC) 40 MG capsule Take 40 mg by mouth daily. 05/09/15   [provider]  PRESCRIPTION MEDICATION Place 3 drops under the tongue daily. Allergy drops    [provider]  promethazine (PHENERGAN) 12.5 MG tablet Take 1 tablet (12.5 mg total) by mouth every 6 (six) hours as needed for nausea or vomiting. 07/14/16   Kathryne Hitch, MD      Allergies    Actonel [risedronate sodium], Maxitrol [neomycin-polymyxin-dexameth], and Niacin and related    Review of Systems   Review of Systems Review of systems Negative for fall, head trauma, f/c.  A 10 point review of systems was performed and is negative unless otherwise reported in HPI.  Physical Exam Updated Vital Signs BP (!) 150/70 (BP Location: Left Arm)   Pulse 70   Temp 98.5 F (36.9 C) (Oral)   Resp 18   Ht 5\' 3"  (1.6 m)   Wt 63.5 kg  SpO2 99%   BMI 24.80 kg/m  Physical Exam General: Normal appearing female, lying in bed.  HEENT: Sclera anicteric, MMM, trachea midline.  Cardiology: RRR, no murmurs/rubs/gallops. BLmDP pulses equal bilaterally.  Resp: Normal respiratory rate and effort. CTAB, no wheezes, rhonchi, crackles.  Abd: Soft, non-tender, non-distended. No rebound tenderness or guarding.  GU: Deferred. MSK: Mild effusion of the left knee with no erythema, warmth, induration/fluctuance. TTP of the anterior joint line. Able to flex and extend, though ROM limited d/t pain. Neg anterior/posterior drawer. No gross  deformities. Compartments soft. NVI. Skin: warm, dry.  Neuro: A&Ox4, CNs II-XII grossly intact. MAEs. Sensation grossly intact.  Psych: Normal mood and affect.   ED Results / Procedures / Treatments   Labs (all labs ordered are listed, but only abnormal results are displayed) Labs Reviewed - No data to display  EKG None  Radiology L knee XR: No acute fracture or dislocation. Mild arthritic changes.   Procedures Procedures    Medications Ordered in ED Medications  acetaminophen (TYLENOL) tablet 1,000 mg (1,000 mg Oral Given 03/30/23 2233)    ED Course/ Medical Decision Making/ A&P                          Medical Decision Making Amount and/or Complexity of Data Reviewed Radiology: ordered.  Risk OTC drugs.     MDM:    This patient presents with knee pain, suspicious for knee sprain, ligamentous or tendon injury. Able to flex and extend although somewhat limited by pain. Considered, but doubt, tibial plateau fracture, septic arthritis, other acute unstable fracture, or significant neurovascular compromise. Plan: XR, pain control, reassessment   Presentation most consistent with knee sprain without joint instability. History, Exam, and Workup not consistent with fracture, compartment syndrome, arterial or nerve injury. Rx: R.I.C.E  Disposition: Discharge. Strict return precautions discussed and understood at bedside. Follow up with primary care doctor within next week for symptom follow up.       Imaging Studies ordered: I ordered imaging studies including L KNEE XR I independently visualized and interpreted imaging. I agree with the radiologist interpretation  Additional history obtained from chart review.   Reevaluation: After the interventions noted above, I reevaluated the patient and found that they have :improved  Social Determinants of Health: Patient lives independently   Disposition:  DC w/ discharge instructions/return precautions. All  questions answered to patient's satisfaction.    Co morbidities that complicate the patient evaluation  Past Medical History:  Diagnosis Date   Adverse effect of anesthetic    pt states when had partial hysterectomy while in recovery had code blue called on her due to decreased BP    Arthritis    Asthma    GERD (gastroesophageal reflux disease)    History of bronchitis    11/2015 last episode    History of multiple allergies    Hypertension    Pneumonia    PONV (postoperative nausea and vomiting)    when had colonscopy 03/2016 had very difficult time awakening; pt states had chest hit due to BP dropping dramatically    Tinnitus      Medicines Meds ordered this encounter  Medications   acetaminophen (TYLENOL) tablet 1,000 mg    I have reviewed the patients home medicines and have made adjustments as needed  Problem List / ED Course: Problem List Items Addressed This Visit   None Visit Diagnoses     Acute pain of left knee    -  Primary                   This note was created using dictation software, which may contain spelling or grammatical errors.    Loetta Rough, MD 04/13/23 2118

## 2023-03-30 NOTE — ED Notes (Signed)
Discharge paperwork reviewed entirely with patient, including Rx's and follow up care. Pain was under control. Pt verbalized understanding as well as all parties involved. No questions or concerns voiced at the time of discharge. No acute distress noted.   Pt ambulated out to PVA without incident or assistance.  

## 2023-03-30 NOTE — ED Triage Notes (Signed)
Pt reports being prescribed levaquin recently for a sinus infection and was told that there was a chance for tendon rupture with it. Pt states that earlier today around 2pm feeling a pop in left knee with moderate pain. Unable to bear weight on left leg currently without severe pain. Sensation/pulses intact distally.

## 2023-03-30 NOTE — Discharge Instructions (Signed)
Thank you for coming to [facility] Emergency Department. You were seen for knee pain. We did an exam, and imaging, and these showed no acute findings.  It is possible that you have a ligamentous or tendon injury to the knee that was not evaluated here.  It is also possible you have a simple knee sprain. Please follow up with your orthopedic surgeon if your symptoms do not improve within 1-2 weeks. You can take tylenol for pain control. You can also rest, ice, and elevate the knee which will help with swelling/pain.   Do not hesitate to return to the ED or call 911 if you experience: -Worsening symptoms -Numbness/tingling -Lightheadedness, passing out -Fevers/chills -Anything else that concerns you

## 2023-04-02 ENCOUNTER — Ambulatory Visit: Payer: Medicare Other | Admitting: Physician Assistant

## 2023-04-02 ENCOUNTER — Encounter: Payer: Self-pay | Admitting: Physician Assistant

## 2023-04-02 DIAGNOSIS — M25562 Pain in left knee: Secondary | ICD-10-CM | POA: Diagnosis not present

## 2023-04-02 DIAGNOSIS — M79662 Pain in left lower leg: Secondary | ICD-10-CM | POA: Diagnosis not present

## 2023-04-02 MED ORDER — METHYLPREDNISOLONE ACETATE 40 MG/ML IJ SUSP
40.0000 mg | INTRAMUSCULAR | Status: AC | PRN
Start: 2023-04-02 — End: 2023-04-02
  Administered 2023-04-02: 40 mg via INTRA_ARTICULAR

## 2023-04-02 MED ORDER — LIDOCAINE HCL 1 % IJ SOLN
5.0000 mL | INTRAMUSCULAR | Status: AC | PRN
Start: 2023-04-02 — End: 2023-04-02
  Administered 2023-04-02: 5 mL

## 2023-04-02 NOTE — Progress Notes (Signed)
Office Visit Note   Patient: Brooke Cole           Date of Birth: 14-Nov-1946           MRN: [id number] Visit Date: 04/02/2023              Requested by: Drake Leach, MD [address] [place],  [place] [address] PCP: Drake Leach, MD   Assessment & Plan: Visit Diagnoses:  1. Acute pain of left knee   2. Pain of left calf     Plan: Recommend relative rest of the left lower extremity she can bear weight as tolerated on the left lower extremity.  Work on gentle range of motion of the knee.  Ace bandage was applied to the left knee she will remove this prior to returning to bed this evening.  Follow-up with [place] in 2 weeks to see how she is doing overall.  Questions were encouraged and answered.  Differential includes internal derangement left knee and gastroc strain.  Follow-Up Instructions: Return in about 2 weeks (around 04/16/2023).   Orders:  Orders Placed This Encounter  Procedures   Large Joint Inj   No orders of the defined types were placed in this encounter.     Procedures: Large Joint Inj on 04/02/2023 4:28 PM Indications: pain Details: 22 G 1.5 in needle, anterolateral approach  Arthrogram: No  Medications: 5 mL lidocaine 1 %; 40 mg methylPREDNISolone acetate 40 MG/ML Aspirate: 18 mL yellow and blood-tinged Outcome: tolerated well, no immediate complications Procedure, treatment alternatives, risks and benefits explained, specific risks discussed. Consent was given by the patient. Immediately prior to procedure a time out was called to verify the correct patient, procedure, equipment, support staff and site/side marked as required. Patient was prepped and draped in the usual sterile fashion.       Clinical Data: No additional findings.   Subjective: Chief Complaint  Patient presents with   Left Knee - Pain    HPI Brooke Cole 77 year old female who felt a pop in her left knee leg area after going up and down stairs at work on 03/30/2023.  She is  concerned that she may have a tendon injury.  She has been on Levaquin recently and had read that this could cause tendon injuries.  She is on Levaquin for sinus infection.  She is finished the prescription.  Needs denies any fevers or chills.  She was seen in the ER on 03/30/2023 due to the pain in the knee and the left lower leg.  Radiographs were obtained and personally reviewed these these show some mild arthritic narrowing the medial compartment and mild arthritic change of the patellofemoral joint.  No acute fractures knee is well-preserved.  She notes she is unable to bear any significant weight on the knee due to the left knee and lower leg pain.  She has tried Tylenol and Ace wrap.  She is unable to take NSAIDs due to hypertension. Review of Systems See HPI  Objective: Vital Signs: There were no vitals taken for this visit.  Physical Exam Constitutional:      Appearance: She is not ill-appearing or diaphoretic.  Pulmonary:     Effort: Pulmonary effort is normal.  Neurological:     Mental Status: She is alert and oriented to person, place, and time.  Psychiatric:        Mood and Affect: Mood normal.     Ortho Exam Bilateral knees: Good range of motion of both knees.  No instability valgus varus stressing of either knee.  Left knee with slight effusion no abnormal warmth erythema of either knee.  Anterior drawer is negative bilaterally.  McMurray's is negative on the right positive on the left. Bilateral calf supple tenderness left calf maximally at the muscle tendinous junction of the Achilles.  Left Achilles is intact to palpation.  Thompson's test is negative bilaterally.  Specialty Comments:  No specialty comments available.  Imaging: No results found.   PMFS History: Patient Active Problem List   Diagnosis Date Noted   Essential hypertension 03/30/2023   Hyperlipidemia 03/30/2023   Prediabetes 07/16/2021   Osteoarthritis of right knee 07/11/2016   Status post total  right knee replacement 07/11/2016   Esophageal reflux 12/28/2015   Moderate persistent asthma 12/28/2015   Past Medical History:  Diagnosis Date   Adverse effect of anesthetic    pt states when had partial hysterectomy while in recovery had code blue called on her due to decreased BP    Arthritis    Asthma    GERD (gastroesophageal reflux disease)    History of bronchitis    11/2015 last episode    History of multiple allergies    Hypertension    Pneumonia    PONV (postoperative nausea and vomiting)    when had colonscopy 03/2016 had very difficult time awakening; pt states had chest hit due to BP dropping dramatically    Tinnitus     History reviewed. No pertinent family history.  Past Surgical History:  Procedure Laterality Date   ABDOMINAL HYSTERECTOMY     partial 2001   broken hand - 3 screws     2013   EYE SURGERY     cataract removal bilat with implants   TMJ bilateral meniscectomy     [place] Right 07/11/2016   Procedure: RIGHT TOTAL KNEE ARTHROPLASTY;  Surgeon: Mcarthur Rossetti, MD;  Location: [facility];  Service: Orthopedics;  Laterality: Right;   Social History   Occupational History   Not on file  Tobacco Use   Smoking status: Never   Smokeless tobacco: Never  Substance and Sexual Activity   Alcohol use: No   Drug use: No   Sexual activity: Not on file

## 2023-04-16 ENCOUNTER — Ambulatory Visit: Payer: Medicare Other | Admitting: Physician Assistant

## 2023-04-28 ENCOUNTER — Telehealth: Payer: Self-pay | Admitting: Orthopaedic Surgery

## 2023-04-28 NOTE — Telephone Encounter (Signed)
Disregard.  error

## 2023-05-11 ENCOUNTER — Ambulatory Visit: Payer: Medicare Other | Admitting: Physician Assistant

## 2023-05-11 ENCOUNTER — Encounter: Payer: Self-pay | Admitting: Physician Assistant

## 2023-05-11 DIAGNOSIS — M25562 Pain in left knee: Secondary | ICD-10-CM | POA: Diagnosis not present

## 2023-05-11 NOTE — Progress Notes (Signed)
HPI: Brooke Cole returns today follow-up of her left knee.  Again she felt a pop in her knee on 03/30/2023 while going up and down some stairs at work.  On 04/02/2023 we aspirated the knee and injected it with cortisone.  She states this helped for less than a week but she got really good pain relief.  Now the pain is coming back.  She states her knees touching in bed at night causes her significant pain.  She notes a catching sensation in the knee but no locking giving way or painful popping.  She describes the pain as a constant pain medial aspect of the knee.  She has tried salon patches, Voltaren gel relative rest and Ace wrap.  Review of systems see HPI otherwise negative or noncontributory.  Physical exam: General: No acute distress ambulates without any assistive device with a slight antalgic gait. Left knee: Slight effusion.  Tenderness along medial joint line.  Full range of motion of the knee.  McMurray's is negative.  No instability valgus varus stressing.  Impression: Acute left knee pain  Plan: Given the acute nature of her pain and the recurrent effusion along with mild arthritic changes on radiograph recommend MRI.  MRIs to rule out a meniscal tear.  Have her follow-up after the MRI to go over results discuss further treatment.  Questions were encouraged and answered at length.

## 2023-05-26 ENCOUNTER — Telehealth: Payer: Self-pay | Admitting: Orthopaedic Surgery

## 2023-05-26 DIAGNOSIS — M25562 Pain in left knee: Secondary | ICD-10-CM

## 2023-05-26 NOTE — Telephone Encounter (Signed)
MRI needed no referral placed please advise for left knee last seen Rexene Edison

## 2023-05-27 ENCOUNTER — Ambulatory Visit: Payer: Medicare Other | Admitting: Physician Assistant

## 2023-05-27 ENCOUNTER — Encounter: Payer: Self-pay | Admitting: Physician Assistant

## 2023-05-27 DIAGNOSIS — M25562 Pain in left knee: Secondary | ICD-10-CM

## 2023-05-27 NOTE — Progress Notes (Signed)
Office Visit Note   Patient: Brooke Cole           Date of Birth: 09/03/1946           MRN: [id number] Visit Date: 05/27/2023              Requested by: Henri Medal, MD [address] [place],  [place] [address] PCP: Henri Medal, MD  Chief Complaint  Patient presents with   Left Knee - Pain      HPI: [name] is a pleasant 77 year old woman who is a patient of Dr. Eliberto Ivory.  She has a history of recent left knee pain.  She was recently seen by Delane Ginger.  Findings seem to be more consistent with a mechanical issue.  An MRI was ordered she is yet to have this.  She continues to have pain in her left knee and giving way.  Denies any fever chills or any recent injuries since she is seeing Bronson Curb.  She is using a crutch intermittently for stability.  She did have an aspiration and a cortisone injection done in April.  Assessment & Plan: Visit Diagnoses: Left knee pain  Plan: We discussed getting a support for her knee while she is waiting for her MRI.  As she did not have much problems with her knee prior to April she could very well have findings consistent with a meniscus tear as her plain x-ray showed minimal arthritis.  I advised her it was too soon to do another steroid injection and she really did not get more than about a week of relief.  She also had mechanical symptoms such as locking and catching she could obtain again a support for her knee and she is considering this.  Will follow-up with Delane Ginger or Dr. Magnus Ivan after her MRI  Follow-Up Instructions: Dr. Magnus Ivan or Delane Ginger after MRI  Ortho Exam  Patient is alert, oriented, no adenopathy, well-dressed, normal affect, normal respiratory effort.  Examination of her left knee she has no effusion she has some mild soft tissue swelling no redness no erythema she has some tenderness over the medial joint line.  Good range of motion good varus valgus and anterior stability she is neurovascular intact compartments are soft and  nontender Imaging: No results found. No images are attached to the encounter.  Labs: Lab Results  Component Value Date   HGBA1C 6.4 (H) 07/12/2016     No results found for: "ALBUMIN", "PREALBUMIN", "CBC"  No results found for: "MG" No results found for: "VD25OH"  No results found for: "PREALBUMIN"    Latest Ref Rng & Units 07/12/2016    4:53 AM 07/03/2016   11:00 AM  CBC EXTENDED  WBC 4.0 - 10.5 K/uL 10.5  7.3   RBC 3.87 - 5.11 MIL/uL 3.69  4.64   Hemoglobin 12.0 - 15.0 g/dL 19.1  47.8   HCT 29.5 - 46.0 % 33.8  41.0   Platelets 150 - 400 K/uL 137  202      There is no height or weight on file to calculate BMI.  Orders:  No orders of the defined types were placed in this encounter.  No orders of the defined types were placed in this encounter.    Procedures: No procedures performed  Clinical Data: No additional findings.  ROS:  All other systems negative, except as noted in the HPI. Review of Systems  Objective: Vital Signs: There were no vitals taken for this visit.  Specialty Comments:  No specialty  comments available.  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   Esophageal reflux 12/28/2015   Moderate persistent asthma 12/28/2015   Past Medical History:  Diagnosis Date   Adverse effect of anesthetic    pt states when had partial hysterectomy while in recovery had code blue called on her due to decreased BP    Arthritis    Asthma    GERD (gastroesophageal reflux disease)    History of bronchitis    11/2015 last episode    History of multiple allergies    Hypertension    Pneumonia    PONV (postoperative nausea and vomiting)    when had colonscopy 03/2016 had very difficult time awakening; pt states had chest hit due to BP dropping  dramatically    Tinnitus     No family history on file.  Past Surgical History:  Procedure Laterality Date   ABDOMINAL HYSTERECTOMY     partial 2001   broken hand - 3 screws     2013   EYE SURGERY     cataract removal bilat with implants   TMJ bilateral meniscectomy     1989   TONSILLECTOMY     1952   TOTAL KNEE ARTHROPLASTY Right 07/11/2016   Procedure: RIGHT TOTAL KNEE ARTHROPLASTY;  Surgeon: Kathryne Hitch, MD;  Location: [facility];  Service: Orthopedics;  Laterality: Right;   Social History   Occupational History   Not on file  Tobacco Use   Smoking status: Never   Smokeless tobacco: Never  Substance and Sexual Activity   Alcohol use: No   Drug use: No   Sexual activity: Not on file

## 2023-05-28 ENCOUNTER — Telehealth (HOSPITAL_BASED_OUTPATIENT_CLINIC_OR_DEPARTMENT_OTHER): Payer: Self-pay

## 2023-05-31 ENCOUNTER — Ambulatory Visit (HOSPITAL_BASED_OUTPATIENT_CLINIC_OR_DEPARTMENT_OTHER)
Admission: RE | Admit: 2023-05-31 | Discharge: 2023-05-31 | Disposition: A | Discharge status: Home / Self Care | Payer: Medicare Other | Source: Ambulatory Visit | Attending: Physician Assistant | Admitting: Physician Assistant

## 2023-05-31 DIAGNOSIS — M25562 Pain in left knee: Secondary | ICD-10-CM | POA: Diagnosis present

## 2023-06-10 ENCOUNTER — Institutional Professional Consult (permissible substitution) (HOSPITAL_BASED_OUTPATIENT_CLINIC_OR_DEPARTMENT_OTHER): Payer: Medicare Other | Admitting: Pulmonary Disease

## 2023-06-22 ENCOUNTER — Encounter: Payer: Self-pay | Admitting: Orthopaedic Surgery

## 2023-06-22 ENCOUNTER — Ambulatory Visit: Payer: Medicare Other | Admitting: Orthopaedic Surgery

## 2023-06-22 DIAGNOSIS — S83242D Other tear of medial meniscus, current injury, left knee, subsequent encounter: Secondary | ICD-10-CM | POA: Diagnosis not present

## 2023-06-22 DIAGNOSIS — M25562 Pain in left knee: Secondary | ICD-10-CM

## 2023-06-22 NOTE — Progress Notes (Signed)
The patient is a 77 year old active female who comes in today to go over a MRI of her left knee.  We did replace her right knee and her left knee has done great until April 1 of this year when she was coming down some stairs and felt a pop in her knee.  She has had medial joint line tenderness and pain with locking and catching since then.  Her x-rays still showed a well-maintained joint space and after failure conservative treatment a MRI was recommended.  She is here for review this today.  Her left knee still shows a positive McMurray's exam to the medial compartment knee.  There is medial swelling and pain on exam with good range of motion.    MRI of her left knee does show a complex tear of the posterior horn and root of the meniscus on the medial aspect of the knee.  The lateral meniscus is intact.  The ligaments are intact.  There is mild to moderate thinning of the cartilage throughout the knee but no areas of full-thickness cartilage loss.  Given the continued mechanical symptoms of her left knee combined with the clinical exam findings and MRI findings.  We are recommending a left knee arthroscopy with partial medial meniscectomy.  She understands that we will not address any cartilage issues within the blood her symptoms did come all of a sudden and she was asymptomatic before this pop that occurred in her knee combined with her continued mechanical symptoms of locking and catching.  She would like to have the surgery performed sometime in later July after she has some type of sinus procedure on July 15.  We would then see her back a week postoperative.  All questions and concerns were answered and addressed.

## 2023-07-30 ENCOUNTER — Other Ambulatory Visit: Payer: Self-pay | Admitting: Orthopaedic Surgery

## 2023-07-30 DIAGNOSIS — S83232A Complex tear of medial meniscus, current injury, left knee, initial encounter: Secondary | ICD-10-CM | POA: Diagnosis not present

## 2023-07-30 MED ORDER — HYDROCODONE-ACETAMINOPHEN 5-325 MG PO TABS: 1.0000 | ORAL_TABLET | Freq: Four times a day (QID) | ORAL | 0 refills | Status: DC | PRN

## 2023-08-06 ENCOUNTER — Ambulatory Visit (INDEPENDENT_AMBULATORY_CARE_PROVIDER_SITE_OTHER): Payer: Medicare Other | Admitting: Physician Assistant

## 2023-08-06 ENCOUNTER — Encounter: Payer: Self-pay | Admitting: Physician Assistant

## 2023-08-06 DIAGNOSIS — Z9889 Other specified postprocedural states: Secondary | ICD-10-CM

## 2023-08-06 DIAGNOSIS — M1712 Unilateral primary osteoarthritis, left knee: Secondary | ICD-10-CM

## 2023-08-06 NOTE — Progress Notes (Signed)
HPI: Brooke Cole returns today follow-up status post left knee arthroscopy 07/30/2023.  She underwent left knee arthroscopy with partial medial meniscectomy for complex medial meniscal tear.  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  Lateral compartment no meniscal tear.  There is slight cartilage wear of the tibial plateau but no exposed bone.  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  She states that she is having no pain in her knee whatsoever.  She has been able to ambulate without any assistive device and is overall doing well.  Physical exam: General well-developed well-nourished female no acute distress. Left knee: Port sites well approximated with nylon suture.  No abnormal warmth erythema drainage or signs of infection.  Left calf supple nontender.  Full range of motion left knee without pain.  Impression: Status post left knee arthroscopy with partial medial meniscectomy Left knee osteoarthritis  Plan: Will have her work on scar tissue mobilization.  She will continue work on range of motion strengthening knee.  Follow-up with [place] in 1 month sooner if there is any questions or concerns.  Arthroscopy photographs were reviewed with the patient.  Questions were encouraged and answered

## 2023-08-21 ENCOUNTER — Encounter: Payer: Self-pay | Admitting: Physician Assistant

## 2023-08-21 ENCOUNTER — Ambulatory Visit (INDEPENDENT_AMBULATORY_CARE_PROVIDER_SITE_OTHER): Payer: Medicare Other | Admitting: Physician Assistant

## 2023-08-21 DIAGNOSIS — M1712 Unilateral primary osteoarthritis, left knee: Secondary | ICD-10-CM

## 2023-08-21 NOTE — Progress Notes (Signed)
Office Visit Note   Patient: Brooke Cole           Date of Birth: [birth year]           MRN: [id number] Visit Date: 08/21/2023              Requested by: Henri Medal, MD [address] [place],  [place] [address] PCP: Henri Medal, MD  Chief Complaint  Patient presents with   Left Knee - Pain      HPI: Brooke Cole is a pleasant 77 year old woman who is about 4 weeks status post left knee arthroscopy debridement of medial meniscus tear with Dr. Magnus Ivan.  She said she has been actually doing quite well and gone back to work.  She is concerned because the last couple days she has had more soreness over the medial portal.  She thinks it somewhat discolored.  She said it does not seem to be dependent on weightbearing she is sensitive when her pant leg rolls over this area.  She denies any fever or chills.  Assessment & Plan: Visit Diagnoses/Plan 4 weeks postop status post left knee arthroscopy with partial medial meniscectomy.  I do not see anything that would indicate any type of postoperative infection.  She does not have any fluctuance and she has no erythema or cellulitis.  She does have some prominence over the medial portal but this is equivalent to her other knee where she has had a knee replacement more consistent with fat pad.  She has no effusion.  Compartments are soft and nontender.  I have recommended observation.  She can try little Voltaren gel and some desensitization.  Certainly can call back at any time if she had any increasing redness fever chills or tightness in the knee.  Otherwise will follow-up with [facility]  Plan: Right knee well-healed surgical portals no drainage.  No erythema no cellulitis no effusion.  Compartments are soft and compressible.  Negative Homans' sign.  She has a lot of varicosities but cannot appreciate significant color change in this area.  She is sensitive to both light and deep touch.  She is neurovascular intact  Follow-Up  Instructions: No follow-ups on file.   Ortho Exam  Patient is alert, oriented, no adenopathy, well-dressed, normal affect, normal respiratory effort. Plan: Right knee well-healed surgical portals no drainage.  No erythema no cellulitis no effusion.  Compartments are soft and compressible.  Negative Homans' sign.  She has a lot of varicosities but cannot appreciate significant color change in this area.  She is sensitive to both light and deep touch.  She is neurovascular intact  Imaging: No results found. No images are attached to the encounter.  Labs: Lab Results  Component Value Date   HGBA1C 6.4 (H) 07/12/2016     No results found for: "ALBUMIN", "PREALBUMIN", "CBC"  No results found for: "MG" No results found for: "VD25OH"  No results found for: "PREALBUMIN"    Latest Ref Rng & Units 07/12/2016    4:53 AM 07/03/2016   11:00 AM  CBC EXTENDED  WBC 4.0 - 10.5 K/uL 10.5  7.3   RBC 3.87 - 5.11 MIL/uL 3.69  4.64   Hemoglobin 12.0 - 15.0 g/dL 64.4  03.4   HCT 74.2 - 46.0 % 33.8  41.0   Platelets 150 - 400 K/uL 137  202      There is no height or weight on file to calculate BMI.  Orders:  No orders of the defined  types were placed in this encounter.  No orders of the defined types were placed in this encounter.    Procedures: No procedures performed  Clinical Data: No additional findings.  ROS:  All other systems negative, except as noted in the HPI. Review of Systems  Objective: Vital Signs: There were no vitals taken for this visit.  Specialty Comments:  No specialty comments available.  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   Esophageal reflux 12/28/2015   Moderate persistent asthma 12/28/2015   Past Medical History:  Diagnosis Date    Adverse effect of anesthetic    pt states when had partial hysterectomy while in recovery had code blue called on her due to decreased BP    Arthritis    Asthma    GERD (gastroesophageal reflux disease)    History of bronchitis    11/2015 last episode    History of multiple allergies    Hypertension    Pneumonia    PONV (postoperative nausea and vomiting)    when had colonscopy 03/2016 had very difficult time awakening; pt states had chest hit due to BP dropping dramatically    Tinnitus     History reviewed. No pertinent family history.  Past Surgical History:  Procedure Laterality Date   ABDOMINAL HYSTERECTOMY     partial 2001   broken hand - 3 screws     2013   EYE SURGERY     cataract removal bilat with implants   TMJ bilateral meniscectomy     1989   TONSILLECTOMY     1952   TOTAL KNEE ARTHROPLASTY Right 07/11/2016   Procedure: RIGHT TOTAL KNEE ARTHROPLASTY;  Surgeon: Kathryne Hitch, MD;  Location: [facility];  Service: Orthopedics;  Laterality: Right;   Social History   Occupational History   Not on file  Tobacco Use   Smoking status: Never   Smokeless tobacco: Never  Substance and Sexual Activity   Alcohol use: No   Drug use: No   Sexual activity: Not on file

## 2023-09-07 ENCOUNTER — Ambulatory Visit: Payer: Medicare Other | Admitting: Physician Assistant

## 2023-09-14 ENCOUNTER — Ambulatory Visit (INDEPENDENT_AMBULATORY_CARE_PROVIDER_SITE_OTHER): Payer: Medicare Other | Admitting: Physician Assistant

## 2023-09-14 ENCOUNTER — Encounter: Payer: Self-pay | Admitting: Physician Assistant

## 2023-09-14 DIAGNOSIS — Z9889 Other specified postprocedural states: Secondary | ICD-10-CM

## 2023-09-14 DIAGNOSIS — M1712 Unilateral primary osteoarthritis, left knee: Secondary | ICD-10-CM

## 2023-09-14 MED ORDER — DOXYCYCLINE HYCLATE 20 MG PO TABS: 20.0000 mg | ORAL_TABLET | Freq: Every day | ORAL | Status: AC

## 2023-09-14 MED ORDER — METHYLPREDNISOLONE ACETATE 40 MG/ML IJ SUSP
40.0000 mg | INTRAMUSCULAR | Status: AC | PRN
Start: 2023-09-14 — End: 2023-09-14
  Administered 2023-09-14: 40 mg via INTRA_ARTICULAR

## 2023-09-14 MED ORDER — LIDOCAINE HCL 1 % IJ SOLN
3.0000 mL | INTRAMUSCULAR | Status: AC | PRN
Start: 2023-09-14 — End: 2023-09-14
  Administered 2023-09-14: 3 mL

## 2023-09-14 NOTE — Progress Notes (Signed)
Procedure Note  Patient: Brooke Cole             Date of Birth: [birth year]           MRN: [id number]             Visit Date: 09/14/2023 Ms. Stapf returns today status post left knee arthroscopy 07/30/2023.  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  She underwent partial medial meniscectomy and had no lateral compartment meniscal tears or changes.  She is still having difficulty going up and down stairs.  She takes Tylenol and uses Voltaren gel which helps some.  She feels that her legs are weak.  Review of systems: Denies any fevers chills or ongoing infections.  Physical exam: Left knee no abnormal warmth erythema or effusion.  Port sites well-healed.  Medial ports slight with slight tenderness and mild scar formation.  Tenderness medial joint line.  Procedures: Visit Diagnoses:  1. S/P left knee arthroscopy   2. Unilateral primary osteoarthritis, left knee     Large Joint Inj: L knee on 09/14/2023 11:38 AM Indications: pain Details: 22 G 1.5 in needle, anterolateral approach  Arthrogram: No  Medications: 3 mL lidocaine 1 %; 40 mg methylPREDNISolone acetate 40 MG/ML Outcome: tolerated well, no immediate complications Procedure, treatment alternatives, risks and benefits explained, specific risks discussed. Consent was given by the patient. Immediately prior to procedure a time out was called to verify the correct patient, procedure, equipment, support staff and site/side marked as required. Patient was prepped and draped in the usual sterile fashion.     Plan: I reviewed knee friendly exercises with the patient.  She is still having some discomfort in her right knee and quad region.  History of right total knee arthroplasty.  Therefore we will send her to physical therapy for quad strengthening both knees.  Have her follow-up with [place] in 4 to 6 weeks see how she is doing overall.  May benefit from  viscosupplementation injection left knee in the future.  Questions encouraged and answered at length.

## 2023-10-14 ENCOUNTER — Encounter: Payer: Self-pay | Admitting: Orthopaedic Surgery

## 2023-10-14 ENCOUNTER — Ambulatory Visit: Payer: Medicare Other | Admitting: Orthopaedic Surgery

## 2023-10-14 DIAGNOSIS — Z9889 Other specified postprocedural states: Secondary | ICD-10-CM

## 2023-10-14 DIAGNOSIS — M1712 Unilateral primary osteoarthritis, left knee: Secondary | ICD-10-CM

## 2023-10-14 NOTE — Progress Notes (Signed)
HPI: Brooke Cole comes in today for follow-up status post left knee intra-articular injection 09/14/2023.  States the injection was very helpful.  She Brooke Cole has constant pain in the knee anymore.  She states that the pain is mostly in the posterior aspect of the knees 2 out of 10 pain at worst.  She is going to physical therapy was beneficial.  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  Physical exam: Left knee full extension full flexion.  Nontender about medial lateral joint line.  No abnormal warmth erythema or effusion   Impression: Status post left knee arthroscopy Left knee osteoarthritis  Plan: Discussed with her cortisone injections which she could have every 3 months if needed for the knee arthritis.  Also use discussed viscosupplementation injections.  She will continue physical therapy.  She will let us know if she wishes to have viscosupplementation injection or cortisone injection in the future.  Follow-up with [place] on an as-needed basis.  Questions were encouraged and answered chronic

## 2024-02-04 ENCOUNTER — Ambulatory Visit: Payer: Medicare Other | Admitting: Physician Assistant

## 2024-02-04 ENCOUNTER — Other Ambulatory Visit (INDEPENDENT_AMBULATORY_CARE_PROVIDER_SITE_OTHER): Payer: Medicare Other

## 2024-02-04 ENCOUNTER — Encounter: Payer: Self-pay | Admitting: Physician Assistant

## 2024-02-04 DIAGNOSIS — M1712 Unilateral primary osteoarthritis, left knee: Secondary | ICD-10-CM

## 2024-02-04 NOTE — Progress Notes (Signed)
 HPI: Brooke Cole comes in today due to left knee pain.  She is someone who has known osteoarthritis left knee.  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  Since then she has had injections therapy tried a knee brace and is now currently on the crutch due to knee pain.  She states her knee is giving way pains 8-9 out of 10 pain at worst.  She states she is ready to have the knee replaced.  She has had prior right knee arthroplasty which is done well.  She denies any fevers chills shortness of breath or chest pain.  Review of systems: See HPI otherwise negative  Physical exam: General Well-developed well-nourished pleasant female in no acute distress Psych: Alert and oriented x 3 Respirations: Unlabored Bilateral knees: Good range of motion of both knees.  Left knee no abnormal warmth erythema tenderness along medial joint line.  Slight patellofemoral crepitus.  No instability valgus varus stressing left knee.  Radiographs: Left knee 2 views shows moderate patellofemoral arthritis.  Near bone-on-bone medial compartmental arthritis.  Lateral compartment overall well-preserved.  No acute fractures or bony abnormalities.  Impression: Left knee osteoarthritis  Plan: Given patient's failure of conservative treatment recommend left total knee arthroplasty.  She wished to proceed with this in the near future.  Questions were encouraged and answered.  Risks reviewed with patient.  Risk include but are not limited to DVT/PE, nerve vessel injury, wound healing problems, infection, prolonged pain worsening pain.  Of note with her right total knee arthroplasty she did not tolerate oxycodone  well.

## 2024-03-24 ENCOUNTER — Telehealth: Payer: Self-pay | Admitting: Orthopaedic Surgery

## 2024-03-24 NOTE — Telephone Encounter (Signed)
 Patient called and said that she needed to have a biopsy on her finger and was told she may not want to be cutting on her since she has knee surgery coming. She needs to know what to do? CB#[phone]

## 2024-03-25 NOTE — Telephone Encounter (Signed)
Patient aware of the below message from Dr. Blackman  

## 2024-04-03 NOTE — Patient Instructions (Signed)
 SURGICAL WAITING ROOM VISITATION Patients having surgery or a procedure may have no more than 2 support people in the waiting area - these visitors may rotate in the visitor waiting room.   If the patient needs to stay at the hospital during part of their recovery, the visitor guidelines for inpatient rooms apply.  PRE-OP VISITATION  Pre-op nurse will coordinate an appropriate time for 1 support person to accompany the patient in pre-op.  This support person may not rotate.  This visitor will be contacted when the time is appropriate for the visitor to come back in the pre-op area.  Please refer to the [facility] website for the visitor guidelines for Inpatients (after your surgery is over and you are in a regular room).  You are not required to quarantine at this time prior to your surgery. However, you must do this: Hand Hygiene often Do NOT share personal items Notify your provider if you are in close contact with someone who has COVID or you develop fever 100.4 or greater, new onset of sneezing, cough, sore throat, shortness of breath or body aches.  If you test positive for Covid or have been in contact with anyone that has tested positive in the last 10 days please notify you surgeon.    Your procedure is scheduled on:  FRIDAY  April 08, 2024  Report to [facility] Main Entrance: [address] entrance where the [employer] is available.   Report to admitting at: 05:15 AM  Call this number if you have any questions or problems the morning of surgery [phone]  Do not eat food after Midnight the night prior to your surgery/procedure.  After Midnight you may have the following liquids until 04:15  AM DAY OF SURGERY  Clear Liquid Diet Water Black Coffee (sugar ok, NO MILK/CREAM OR CREAMERS)  Tea (sugar ok, NO MILK/CREAM OR CREAMERS) regular and decaf                             Plain Jell-O  with no fruit (NO RED)                                           Fruit ices (not  with fruit pulp, NO RED)                                     Popsicles (NO RED)                                                                  Juice: NO CITRUS JUICES: only apple, WHITE grape, WHITE cranberry Sports drinks like Gatorade or Powerade (NO RED)                   The day of surgery:  Drink ONE (1) Pre-Surgery G2 at  04:15 AM the morning of surgery. Drink in one sitting. Do not sip.  This drink was given to you during your hospital pre-op appointment visit. Nothing else to drink after completing the Pre-Surgery G2 :  No candy, chewing gum or throat lozenges.    FOLLOW ANY ADDITIONAL PRE OP INSTRUCTIONS YOU RECEIVED FROM YOUR SURGEON'S OFFICE!!!   Oral Hygiene is also important to reduce your risk of infection.        Remember - BRUSH YOUR TEETH THE MORNING OF SURGERY WITH YOUR REGULAR TOOTHPASTE  Do NOT smoke after Midnight the night before surgery.  STOP TAKING all Vitamins, Herbs and supplements 1 week before your surgery.   Take ONLY these medicines the morning of surgery with A SIP OF WATER: Metoprolol, Cetirizine, and Tylenol if needed for pain. You may use your Eye drops/ ointment, Flonase nasal spray and your Inhalers if needed. Please bring your Albuterol inhaler with you on the day of the surgery.  You may not have any metal on your body including hair pins, jewelry, and body piercing  Do not wear make-up, lotions, powders, perfumes or deodorant  Do not wear nail polish including gel and S&S, artificial / acrylic nails, or any other type of covering on natural nails including finger and toenails. If you have artificial nails, gel coating, etc., that needs to be removed by a nail salon, Please have this removed prior to surgery. Not doing so may mean that your surgery could be cancelled or delayed if the Surgeon or anesthesia staff feels like they are unable to monitor you safely.   Do not shave 48 hours prior to surgery to avoid nicks in your skin which may  contribute to postoperative infections.   Contacts, Hearing Aids, dentures or bridgework may not be worn into surgery. DENTURES WILL BE REMOVED PRIOR TO SURGERY PLEASE DO NOT APPLY "Poly grip" OR ADHESIVES!!!  You may bring a small overnight bag with you on the day of surgery, only pack items that are not valuable. [facility] IS NOT RESPONSIBLE   FOR VALUABLES THAT ARE LOST OR STOLEN.   Do not bring your home medications to the hospital. The Pharmacy will dispense medications listed on your medication list to you during your admission in the Hospital.  Special Instructions: Bring a copy of your healthcare power of attorney and living will documents the day of surgery, if you wish to have them scanned into your [facility] Records- EPIC  Please read over the following fact sheets you were given: IF YOU HAVE QUESTIONS ABOUT YOUR PRE-OP INSTRUCTIONS, PLEASE CALL [phone].     Pre-operative 5 CHG Bath Instructions   You can play a key role in reducing the risk of infection after surgery. Your skin needs to be as free of germs as possible. You can reduce the number of germs on your skin by washing with CHG (chlorhexidine gluconate) soap before surgery. CHG is an antiseptic soap that kills germs and continues to kill germs even after washing.   DO NOT use if you have an allergy to chlorhexidine/CHG or antibacterial soaps. If your skin becomes reddened or irritated, stop using the CHG and notify one of our RNs at [phone]  Please shower with the CHG soap starting 4 days before surgery using the following schedule: START SHOWERS ON MONDAY  April 04, 2024  Please keep in mind the following:  DO NOT shave, including legs and underarms, starting the day of your first shower.   You may shave your face at any point  before/day of surgery.   Place clean sheets on your bed the day you start using CHG soap. Use a clean washcloth (not used since being washed) for each shower. DO NOT sleep with pets once you start using the CHG.   CHG Shower Instructions:  If you choose to wash your hair and private area, wash first with your normal shampoo/soap.  After you use shampoo/soap, rinse your hair and body thoroughly to remove shampoo/soap residue.  Turn the water OFF and apply about 3 tablespoons (45 ml) of CHG soap to a CLEAN washcloth.  Apply CHG soap ONLY FROM YOUR NECK DOWN TO YOUR TOES (washing for 3-5 minutes)  DO NOT use CHG soap on face, private areas, open wounds, or sores.  Pay special attention to the area where your surgery is being performed.  If you are having back surgery, having someone wash your back for you may be helpful.  Wait 2 minutes after CHG soap is applied, then you may rinse off the CHG soap.  Pat dry with a clean towel  Put on clean clothes/pajamas   If you choose to wear lotion, please use ONLY the CHG-compatible lotions on the back of this paper.     Additional instructions for the day of surgery: DO NOT APPLY any lotions, deodorants, cologne, or perfumes.   Put on clean/comfortable clothes.  Brush your teeth.  Ask your nurse before applying any prescription medications to the skin.      CHG Compatible Lotions   Aveeno Moisturizing lotion  Cetaphil Moisturizing Cream  Cetaphil Moisturizing Lotion  Clairol Herbal Essence Moisturizing Lotion, Dry Skin  Clairol Herbal Essence Moisturizing Lotion, Extra Dry Skin  Clairol Herbal Essence Moisturizing Lotion, Normal Skin  Curel Age Defying Therapeutic Moisturizing Lotion with Alpha Hydroxy  Curel Extreme Care Body Lotion  Curel Soothing Hands Moisturizing Hand Lotion  Curel Therapeutic Moisturizing Cream, Fragrance-Free  Curel Therapeutic Moisturizing Lotion, Fragrance-Free  Curel Therapeutic Moisturizing Lotion, Original  Formula  Eucerin Daily Replenishing Lotion  Eucerin Dry Skin Therapy Plus Alpha Hydroxy Crme  Eucerin Dry Skin Therapy Plus Alpha Hydroxy Lotion  Eucerin Original Crme  Eucerin Original Lotion  Eucerin Plus Crme Eucerin Plus Lotion  Eucerin TriLipid Replenishing Lotion  Keri Anti-Bacterial Hand Lotion  Keri Deep Conditioning Original Lotion Dry Skin Formula Softly Scented  Keri Deep Conditioning Original Lotion, Fragrance Free Sensitive Skin Formula  Keri Lotion Fast Absorbing Fragrance Free Sensitive Skin Formula  Keri Lotion Fast Absorbing Softly Scented Dry Skin Formula  Keri Original Lotion  Keri Skin Renewal Lotion Keri Silky Smooth Lotion  Keri Silky Smooth Sensitive Skin Lotion  Nivea Body Creamy Conditioning Oil  Nivea Body Extra Enriched Lotion  Nivea Body Original Lotion  Nivea Body Sheer Moisturizing Lotion Nivea Crme  Nivea Skin Firming Lotion  NutraDerm 30 Skin Lotion  NutraDerm Skin Lotion  NutraDerm Therapeutic Skin Cream  NutraDerm Therapeutic Skin Lotion  ProShield Protective Hand Cream  Provon moisturizing lotion   FAILURE TO FOLLOW THESE INSTRUCTIONS MAY RESULT IN THE CANCELLATION OF YOUR SURGERY  PATIENT SIGNATURE_________________________________  NURSE SIGNATURE__________________________________  ________________________________________________________________________         Brooke Cole    An incentive spirometer is a tool that can help keep your lungs clear and active. This tool measures how well you are filling your lungs with each  breath. Taking long deep breaths may help reverse or decrease the chance of developing breathing (pulmonary) problems (especially infection) following: A long period of time when you are unable to move or be active. BEFORE THE PROCEDURE  If the spirometer includes an indicator to show your best effort, your nurse or respiratory therapist will set it to a desired goal. If possible, sit up straight or  lean slightly forward. Try not to slouch. Hold the incentive spirometer in an upright position. INSTRUCTIONS FOR USE  Sit on the edge of your bed if possible, or sit up as far as you can in bed or on a chair. Hold the incentive spirometer in an upright position. Breathe out normally. Place the mouthpiece in your mouth and seal your lips tightly around it. Breathe in slowly and as deeply as possible, raising the piston or the ball toward the top of the column. Hold your breath for 3-5 seconds or for as long as possible. Allow the piston or ball to fall to the bottom of the column. Remove the mouthpiece from your mouth and breathe out normally. Rest for a few seconds and repeat Steps 1 through 7 at least 10 times every 1-2 hours when you are awake. Take your time and take a few normal breaths between deep breaths. The spirometer may include an indicator to show your best effort. Use the indicator as a goal to work toward during each repetition. After each set of 10 deep breaths, practice coughing to be sure your lungs are clear. If you have an incision (the cut made at the time of surgery), support your incision when coughing by placing a pillow or rolled up towels firmly against it. Once you are able to get out of bed, walk around indoors and cough well. You may stop using the incentive spirometer when instructed by your caregiver.  RISKS AND COMPLICATIONS Take your time so you do not get dizzy or light-headed. If you are in pain, you may need to take or ask for pain medication before doing incentive spirometry. It is harder to take a deep breath if you are having pain. AFTER USE Rest and breathe slowly and easily. It can be helpful to keep track of a log of your progress. Your caregiver can provide you with a simple table to help with this. If you are using the spirometer at home, follow these instructions: SEEK MEDICAL CARE IF:  You are having difficultly using the spirometer. You have  trouble using the spirometer as often as instructed. Your pain medication is not giving enough relief while using the spirometer. You develop fever of 100.5 F (38.1 C) or higher.                                                                                                    SEEK IMMEDIATE MEDICAL CARE IF:  You cough up bloody sputum that had not been present before. You develop fever of 102 F (38.9 C) or greater. You develop worsening pain at or near the incision site. MAKE SURE YOU:  Understand these instructions. Will watch  your condition. Will get help right away if you are not doing well or get worse. Document Released: 04/27/2007 Document Revised: 03/08/2012 Document Reviewed: 06/28/2007 [facility] Patient Information 2014 [place], [place].       If you would like to see a video about joint replacement:   [email or web address]

## 2024-04-03 NOTE — Progress Notes (Signed)
 COVID Vaccine received:  []  No [x]  Yes Date of any COVID positive Test in last 90 days:  PCP - Henri Medal, MD at [facility]  [phone] (Work) [phone] (Fax)  Cardiologist -   Chest x-ray - 05-07-2023  2v  CEW EKG -07-03-2016 Epic   will repeat   Stress Test -  ECHO -  Cardiac Cath -   PCR screen: [x]  Ordered & Completed []   No Order but Needs PROFEND     []   N/A for this surgery  Surgery Plan:  []  Ambulatory   [x]  Outpatient in bed  []  Admit Anesthesia:    []  General  []  Spinal  [x]   Choice []   MAC  Pacemaker / ICD device [x]  No []  Yes   Spinal Cord Stimulator:[x]  No []  Yes       History of Sleep Apnea? [x]  No []  Yes   CPAP used?- [x]  No []  Yes    Does the patient monitor blood sugar?   []  N/A   []  No []  Yes  Patient has: []  NO Hx DM   [x]  Pre-DM   []  DM1  []   DM2 Last A1c was: 6.3 on 01-28-24      Blood Thinner / Instructions:  none Aspirin Instructions:  none  ERAS Protocol Ordered: []  No  [x]  Yes PRE-SURGERY []  ENSURE  [x]  G2  Patient is to be NPO after: 0415  Dental hx: []  Dentures:  []  N/A      []  Bridge or Partial:                   []  Loose or Damaged teeth:   Comments: Patient was given the 5 CHG shower / bath instructions for TKA surgery along with 2 bottles of the CHG soap. Patient will start this on:  04-04-24 All questions were asked and answered, Patient voiced understanding of this process.   Activity level: Patient is able / unable to climb a flight of stairs without difficulty; []  No CP  []  No SOB, but would have ___   Patient can / can not perform ADLs without assistance.   Anesthesia review: HTN, Pre-DM (no Meds), asthma, GERD, PONV, During hysterectomy 2001- ?  BP; called code blue  Patient denies shortness of breath, fever, cough and chest pain at PAT appointment.  Patient verbalized understanding and agreement to the Pre-Surgical Instructions that were given to them at this PAT appointment. Patient was also educated of the need to review these PAT  instructions again prior to her surgery.I reviewed the appropriate phone numbers to call if they have any and questions or concerns.

## 2024-04-04 ENCOUNTER — Encounter (HOSPITAL_COMMUNITY): Payer: Self-pay

## 2024-04-04 ENCOUNTER — Encounter (HOSPITAL_COMMUNITY)
Admission: RE | Admit: 2024-04-04 | Discharge: 2024-04-04 | Disposition: A | Discharge status: Home / Self Care | Source: Ambulatory Visit | Attending: Orthopaedic Surgery | Admitting: Orthopaedic Surgery

## 2024-04-04 ENCOUNTER — Other Ambulatory Visit: Payer: Self-pay

## 2024-04-04 VITALS — BP 149/66 | HR 60 | Temp 98.5°F | Resp 14 | Ht 62.0 in | Wt 152.0 lb

## 2024-04-04 DIAGNOSIS — M1712 Unilateral primary osteoarthritis, left knee: Secondary | ICD-10-CM | POA: Insufficient documentation

## 2024-04-04 DIAGNOSIS — R7303 Prediabetes: Secondary | ICD-10-CM | POA: Insufficient documentation

## 2024-04-04 DIAGNOSIS — R9431 Abnormal electrocardiogram [ECG] [EKG]: Secondary | ICD-10-CM | POA: Diagnosis not present

## 2024-04-04 DIAGNOSIS — I10 Essential (primary) hypertension: Secondary | ICD-10-CM | POA: Insufficient documentation

## 2024-04-04 DIAGNOSIS — Z01818 Encounter for other preprocedural examination: Secondary | ICD-10-CM | POA: Insufficient documentation

## 2024-04-04 DIAGNOSIS — Z01810 Encounter for preprocedural cardiovascular examination: Secondary | ICD-10-CM | POA: Diagnosis present

## 2024-04-04 DIAGNOSIS — Z01812 Encounter for preprocedural laboratory examination: Secondary | ICD-10-CM | POA: Diagnosis present

## 2024-04-04 HISTORY — DX: Malignant (primary) neoplasm, unspecified: C80.1

## 2024-04-04 HISTORY — DX: Chronic kidney disease, unspecified: N18.9

## 2024-04-04 HISTORY — DX: Anxiety disorder, unspecified: F41.9

## 2024-04-04 HISTORY — DX: Prediabetes: R73.03

## 2024-04-04 LAB — CBC
HCT: 41.5 % (ref 36.0–46.0)
Hemoglobin: 13 g/dL (ref 12.0–15.0)
MCH: 31 pg (ref 26.0–34.0)
MCHC: 31.3 g/dL (ref 30.0–36.0)
MCV: 98.8 fL (ref 80.0–100.0)
Platelets: 171 10*3/uL (ref 150–400)
RBC: 4.2 MIL/uL (ref 3.87–5.11)
RDW: 13.2 % (ref 11.5–15.5)
WBC: 9.4 10*3/uL (ref 4.0–10.5)
nRBC: 0 % (ref 0.0–0.2)

## 2024-04-04 LAB — BASIC METABOLIC PANEL WITH GFR
Anion gap: 9 (ref 5–15)
BUN: 33 mg/dL — ABNORMAL HIGH (ref 8–23)
CO2: 25 mmol/L (ref 22–32)
Calcium: 9.7 mg/dL (ref 8.9–10.3)
Chloride: 103 mmol/L (ref 98–111)
Creatinine, Ser: 0.79 mg/dL (ref 0.44–1.00)
GFR, Estimated: >60 mL/min (ref >60)
Glucose, Bld: 101 mg/dL — ABNORMAL HIGH (ref 70–99)
Potassium: 4.2 mmol/L (ref 3.5–5.1)
Sodium: 137 mmol/L (ref 135–145)

## 2024-04-04 LAB — SURGICAL PCR SCREEN
MRSA, PCR: NEGATIVE
Staphylococcus aureus: NEGATIVE

## 2024-04-07 DIAGNOSIS — M1712 Unilateral primary osteoarthritis, left knee: Secondary | ICD-10-CM | POA: Insufficient documentation

## 2024-04-07 NOTE — Anesthesia Preprocedure Evaluation (Addendum)
 Anesthesia Evaluation  Patient identified by MRN, date of birth, ID band Patient awake    Reviewed: Allergy & Precautions, H&P , Patient's Chart, lab work & pertinent test results, Unable to perform ROS - Chart review only  History of Anesthesia Complications (+) PONV and history of anesthetic complications  Airway Mallampati: II  TM Distance: >3 FB Neck ROM: full    Dental no notable dental hx.    Pulmonary asthma , pneumonia   Pulmonary exam normal        Cardiovascular Exercise Tolerance: Good hypertension, Pt. on medications and Pt. on home beta blockers Normal cardiovascular exam Rate:Bradycardia     Neuro/Psych  PSYCHIATRIC DISORDERS Anxiety     negative neurological ROS     GI/Hepatic Neg liver ROS,GERD  Medicated,,  Endo/Other  negative endocrine ROS    Renal/GU Renal disease     Musculoskeletal  (+) Arthritis ,    Abdominal Normal abdominal exam  (+)   Peds  Hematology negative hematology ROS (+)   Anesthesia Other Findings   Reproductive/Obstetrics negative OB ROS                             Anesthesia Physical Anesthesia Plan  ASA: 2  Anesthesia Plan:    Post-op Pain Management: Tylenol PO (pre-op)* and Regional block*   Induction:   PONV Risk Score and Plan: Treatment may vary due to age or medical condition, Ondansetron and Dexamethasone  Airway Management Planned:   Additional Equipment:   Intra-op Plan:   Post-operative Plan:   Informed Consent:   Plan Discussed with:   Anesthesia Plan Comments:         Anesthesia Quick Evaluation

## 2024-04-07 NOTE — H&P (Signed)
 TOTAL KNEE ADMISSION H&P  Patient is being admitted for left total knee arthroplasty.  Subjective:  Chief Complaint:left knee pain.  HPI: Brooke Cole, 78 y.o. female, has a history of pain and functional disability in the left knee due to arthritis and has failed non-surgical conservative treatments for greater than 12 weeks to includeNSAID's and/or analgesics, corticosteriod injections, viscosupplementation injections, flexibility and strengthening excercises, and activity modification.  Onset of symptoms was gradual, starting several years ago with gradually worsening course since that time. The patient noted prior procedures on the knee to include  arthroscopy on the left knee(s).  Patient currently rates pain in the left knee(s) at 10 out of 10 with activity. Patient has night pain, worsening of pain with activity and weight bearing, pain that interferes with activities of daily living, and pain with passive range of motion.  Patient has evidence of subchondral sclerosis, periarticular osteophytes, and joint space narrowing by imaging studies. There is no active infection.  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   Chronic kidney disease    benign kidney tumor, stable for years   GERD (gastroesophageal reflux disease)    History of bronchitis    11/2015 last episode    History of multiple allergies    Hypertension    Pneumonia    PONV  (postoperative nausea and vomiting)    when had colonscopy 03/2016 had very difficult time awakening; pt states had chest hit due to BP dropping dramatically    Pre-diabetes    Tinnitus     Past Surgical History:  Procedure Laterality Date   ABDOMINAL HYSTERECTOMY     partial 2001   broken hand - 3 screws Right 2013   ORIF right hand   EYE SURGERY     cataract removal bilat with implants   TMJ bilateral meniscectomy  1989   TONSILLECTOMY     1952   TOTAL KNEE ARTHROPLASTY Right 07/11/2016   Procedure: RIGHT TOTAL KNEE ARTHROPLASTY;  Surgeon: Kathryne Hitch, MD;  Location: [facility];  Service: Orthopedics;  Laterality: Right;    No current facility-administered medications for this encounter.   Current Outpatient Medications  Medication Sig Dispense Refill Last Dose/Taking   acetaminophen (TYLENOL) 500 MG tablet Take 500-1,000 mg by mouth every 6 (six) hours as needed (pain.).   Taking As Needed   albuterol (PROAIR HFA) 108 (90 Base) MCG/ACT inhaler Inhale 2 puffs into the lungs every 4 (four) hours as needed for wheezing or shortness of breath.    Taking As Needed   amLODipine (NORVASC) 10 MG tablet Take 10 mg by mouth every evening.   Taking   cetirizine (ZYRTEC) 10 MG tablet Take 10 mg by mouth in the morning.   Taking   doxycycline (PERIOSTAT) 20 MG tablet Take 1 tablet (20 mg total) by mouth daily. (Patient taking differently: Take 20 mg by mouth at bedtime.)  Taking Differently   EPINEPHrine (EPIPEN 2-PAK) 0.3 mg/0.3 mL IJ SOAJ injection Inject 0.3 mg into the skin as needed for anaphylaxis.   Taking As Needed   fluticasone (FLONASE) 50 MCG/ACT nasal spray Place 2 sprays into the nose daily as needed for allergies.   Taking As Needed   Hypromellose (RETAINE [facility] OP) Place 1-2 drops into both eyes 3 (three) times daily as needed (dry/irritated eyes.).   Taking As Needed   lisinopril (PRINIVIL,ZESTRIL) 40 MG tablet Take 40 mg by mouth in the morning.   Taking   metoprolol  (LOPRESSOR) 50 MG tablet Take 25 mg by mouth 2 (two) times daily.   Taking   mometasone-formoterol (DULERA) 200-5 MCG/ACT AERO Inhale 2 puffs into the lungs 2 (two) times daily.   Taking   montelukast (SINGULAIR) 10 MG tablet Take 10 mg by mouth at bedtime.   Taking   Multiple Vitamins-Minerals (EYE MULTIVITAMIN/LUTEIN PO) Take 1 tablet by mouth in the morning.   Taking   Omega-3 Krill Oil 1000 MG CAPS Take 1 capsule by mouth in the morning.   Taking   omeprazole (PRILOSEC) 20 MG capsule Take 20 mg by mouth every evening.   Taking   ondansetron (ZOFRAN-ODT) 4 MG disintegrating tablet Take 4 mg by mouth every 8 (eight) hours as needed for nausea or vomiting.   Taking As Needed   PRESCRIPTION MEDICATION Place 3 drops under the tongue daily. Allergy drops   Taking   sertraline (ZOLOFT) 50 MG tablet Take 50 mg by mouth at bedtime.   Taking   sodium chloride (MURO 128) 5 % ophthalmic ointment Place 1 Application into both eyes at bedtime. 0.25 inch   Taking   tacrolimus (PROTOPIC) 0.1 % ointment Apply 1 Application topically in the morning.   Taking   triamcinolone cream (KENALOG) 0.1 % Apply 1 Application topically 2 (two) times daily as needed (eczema).   Taking As Needed   Allergies  Allergen Reactions   Actonel [Risedronate Sodium] Cough    Chest pain; difficulty with swallowing    Oxycodone Nausea And Vomiting    Other Reaction(s): Other (See Comments)  Feels bad/nausea   Maxitrol [Neomycin-Polymyxin-Dexameth] Swelling    Eye swelling; burning of eyes   Levofloxacin     Ligament tears while on medication   Niacin And Related    Erythromycin Base Rash    Ocular ointment caused redness and pain    Social History   Tobacco Use   Smoking status: Never   Smokeless tobacco: Never  Substance Use Topics   Alcohol use: No    No family history on file.   Review of Systems  Objective:  Physical Exam Vitals reviewed.  Constitutional:      Appearance: Normal appearance. She is normal  weight.  HENT:     Head: Normocephalic and atraumatic.  Eyes:     Extraocular Movements: Extraocular movements intact.  Cardiovascular:     Rate and Rhythm: Normal rate and regular rhythm.  Pulmonary:     Effort: Pulmonary effort is normal.  Abdominal:     Palpations: Abdomen is soft.  Musculoskeletal:     Cervical back: Normal range of motion and neck supple.     Left knee: Effusion, bony tenderness and crepitus present. Decreased range of motion. Tenderness present over the medial joint line and lateral joint line. Abnormal alignment and abnormal meniscus.  Neurological:     Mental Status: She is alert and oriented to person, place, and time.  Psychiatric:  Behavior: Behavior normal.     Vital signs in last 24 hours:    Labs:   Estimated body mass index is 27.8 kg/m as calculated from the following:   Height as of 04/04/24: 5\' 2"  (1.575 m).   Weight as of 04/04/24: 68.9 kg.   Imaging Review Plain radiographs demonstrate severe degenerative joint disease of the left knee(s). The overall alignment ismild varus. The bone quality appears to be good for age and reported activity level.      Assessment/Plan:  End stage arthritis, left knee   The patient history, physical examination, clinical judgment of the provider and imaging studies are consistent with end stage degenerative joint disease of the left knee(s) and total knee arthroplasty is deemed medically necessary. The treatment options including medical management, injection therapy arthroscopy and arthroplasty were discussed at length. The risks and benefits of total knee arthroplasty were presented and reviewed. The risks due to aseptic loosening, infection, stiffness, patella tracking problems, thromboembolic complications and other imponderables were discussed. The patient acknowledged the explanation, agreed to proceed with the plan and consent was signed. Patient is being admitted for inpatient treatment for  surgery, pain control, PT, OT, prophylactic antibiotics, VTE prophylaxis, progressive ambulation and ADL's and discharge planning. The patient is planning to be discharged home with home health services

## 2024-04-08 ENCOUNTER — Other Ambulatory Visit: Payer: Self-pay

## 2024-04-08 ENCOUNTER — Encounter (HOSPITAL_COMMUNITY): Payer: Self-pay | Admitting: Orthopaedic Surgery

## 2024-04-08 ENCOUNTER — Inpatient Hospital Stay (HOSPITAL_COMMUNITY)
Admission: RE | Admit: 2024-04-08 | Discharge: 2024-04-12 | DRG: 470 | Disposition: A | Discharge status: Skilled Nursing Facility | Attending: Orthopaedic Surgery | Admitting: Orthopaedic Surgery

## 2024-04-08 ENCOUNTER — Ambulatory Visit (HOSPITAL_COMMUNITY): Admitting: Anesthesiology

## 2024-04-08 ENCOUNTER — Ambulatory Visit (HOSPITAL_COMMUNITY): Admitting: Physician Assistant

## 2024-04-08 ENCOUNTER — Observation Stay (HOSPITAL_COMMUNITY)

## 2024-04-08 ENCOUNTER — Encounter (HOSPITAL_COMMUNITY)
Admission: RE | Disposition: A | Discharge status: Skilled Nursing Facility | Payer: Self-pay | Source: Ambulatory Visit | Attending: Orthopaedic Surgery

## 2024-04-08 DIAGNOSIS — Z96651 Presence of right artificial knee joint: Secondary | ICD-10-CM | POA: Diagnosis present

## 2024-04-08 DIAGNOSIS — R7303 Prediabetes: Secondary | ICD-10-CM | POA: Diagnosis present

## 2024-04-08 DIAGNOSIS — Z96652 Presence of left artificial knee joint: Secondary | ICD-10-CM

## 2024-04-08 DIAGNOSIS — M1712 Unilateral primary osteoarthritis, left knee: Secondary | ICD-10-CM | POA: Diagnosis not present

## 2024-04-08 DIAGNOSIS — Z885 Allergy status to narcotic agent status: Secondary | ICD-10-CM

## 2024-04-08 DIAGNOSIS — I129 Hypertensive chronic kidney disease with stage 1 through stage 4 chronic kidney disease, or unspecified chronic kidney disease: Secondary | ICD-10-CM | POA: Diagnosis present

## 2024-04-08 DIAGNOSIS — Z7951 Long term (current) use of inhaled steroids: Secondary | ICD-10-CM

## 2024-04-08 DIAGNOSIS — M1990 Unspecified osteoarthritis, unspecified site: Secondary | ICD-10-CM | POA: Diagnosis present

## 2024-04-08 DIAGNOSIS — Z79899 Other long term (current) drug therapy: Secondary | ICD-10-CM

## 2024-04-08 DIAGNOSIS — Z888 Allergy status to other drugs, medicaments and biological substances status: Secondary | ICD-10-CM

## 2024-04-08 DIAGNOSIS — K219 Gastro-esophageal reflux disease without esophagitis: Secondary | ICD-10-CM | POA: Diagnosis present

## 2024-04-08 DIAGNOSIS — Z881 Allergy status to other antibiotic agents status: Secondary | ICD-10-CM

## 2024-04-08 DIAGNOSIS — E785 Hyperlipidemia, unspecified: Secondary | ICD-10-CM | POA: Diagnosis present

## 2024-04-08 HISTORY — PX: TOTAL KNEE ARTHROPLASTY: SHX125

## 2024-04-08 SURGERY — ARTHROPLASTY, KNEE, TOTAL
Anesthesia: Spinal | Site: Knee | Laterality: Left

## 2024-04-08 MED ORDER — MORPHINE SULFATE (PF) 2 MG/ML IV SOLN: 0.5000 mg | INTRAVENOUS | Status: DC | PRN

## 2024-04-08 MED ORDER — ASPIRIN 81 MG PO CHEW
81.0000 mg | CHEWABLE_TABLET | Freq: Two times a day (BID) | ORAL | Status: DC
  Administered 2024-04-08 – 2024-04-12 (×8): 81 mg via ORAL
  Filled 2024-04-08 (×8): qty 1

## 2024-04-08 MED ORDER — PHENOL 1.4 % MT LIQD: 1.0000 | OROMUCOSAL | Status: DC | PRN

## 2024-04-08 MED ORDER — METOCLOPRAMIDE HCL 5 MG PO TABS
5.0000 mg | ORAL_TABLET | Freq: Three times a day (TID) | ORAL | Status: DC | PRN
  Administered 2024-04-10: 10 mg via ORAL
  Filled 2024-04-08: qty 2

## 2024-04-08 MED ORDER — FENTANYL CITRATE (PF) 100 MCG/2ML IJ SOLN
INTRAMUSCULAR | Status: DC | PRN
  Administered 2024-04-08 (×2): 50 ug via INTRAVENOUS

## 2024-04-08 MED ORDER — AMLODIPINE BESYLATE 10 MG PO TABS
10.0000 mg | ORAL_TABLET | Freq: Every evening | ORAL | Status: DC
  Administered 2024-04-09 – 2024-04-10 (×2): 10 mg via ORAL
  Filled 2024-04-08 (×2): qty 1

## 2024-04-08 MED ORDER — FLUTICASONE FUROATE-VILANTEROL 200-25 MCG/ACT IN AEPB
1.0000 | INHALATION_SPRAY | Freq: Every day | RESPIRATORY_TRACT | Status: DC
  Administered 2024-04-08 – 2024-04-12 (×5): 1 via RESPIRATORY_TRACT
  Filled 2024-04-08: qty 28

## 2024-04-08 MED ORDER — DIPHENHYDRAMINE HCL 12.5 MG/5ML PO ELIX
12.5000 mg | ORAL_SOLUTION | ORAL | Status: DC | PRN
  Administered 2024-04-12: 12.5 mg via ORAL
  Filled 2024-04-08: qty 5

## 2024-04-08 MED ORDER — CHLORHEXIDINE GLUCONATE 0.12 % MT SOLN
15.0000 mL | Freq: Once | OROMUCOSAL | Status: AC
  Administered 2024-04-08: 15 mL via OROMUCOSAL

## 2024-04-08 MED ORDER — EPHEDRINE SULFATE-NACL 50-0.9 MG/10ML-% IV SOSY
PREFILLED_SYRINGE | INTRAVENOUS | Status: DC | PRN
  Administered 2024-04-08 (×2): 5 mg via INTRAVENOUS

## 2024-04-08 MED ORDER — PROPOFOL 10 MG/ML IV BOLUS
INTRAVENOUS | Status: AC
  Filled 2024-04-08: qty 20

## 2024-04-08 MED ORDER — METHOCARBAMOL 1000 MG/10ML IJ SOLN: 500.0000 mg | Freq: Four times a day (QID) | INTRAMUSCULAR | Status: DC | PRN

## 2024-04-08 MED ORDER — HYDROMORPHONE HCL 1 MG/ML IJ SOLN: 0.2500 mg | INTRAMUSCULAR | Status: DC | PRN

## 2024-04-08 MED ORDER — EPHEDRINE 5 MG/ML INJ
INTRAVENOUS | Status: AC
  Filled 2024-04-08: qty 5

## 2024-04-08 MED ORDER — DEXAMETHASONE SODIUM PHOSPHATE 4 MG/ML IJ SOLN
INTRAMUSCULAR | Status: DC | PRN
  Administered 2024-04-08: 5 mg via PERINEURAL

## 2024-04-08 MED ORDER — ONDANSETRON HCL 4 MG PO TABS: 4.0000 mg | ORAL_TABLET | Freq: Four times a day (QID) | ORAL | Status: DC | PRN

## 2024-04-08 MED ORDER — DEXAMETHASONE SODIUM PHOSPHATE 10 MG/ML IJ SOLN
INTRAMUSCULAR | Status: AC
  Filled 2024-04-08: qty 1

## 2024-04-08 MED ORDER — LACTATED RINGERS IV SOLN: INTRAVENOUS | Status: DC

## 2024-04-08 MED ORDER — ORAL CARE MOUTH RINSE: 15.0000 mL | Freq: Once | OROMUCOSAL | Status: AC

## 2024-04-08 MED ORDER — HYDROCODONE-ACETAMINOPHEN 5-325 MG PO TABS
1.0000 | ORAL_TABLET | ORAL | Status: DC | PRN
  Administered 2024-04-08 (×2): 1 via ORAL
  Administered 2024-04-09: 2 via ORAL
  Filled 2024-04-08: qty 1
  Filled 2024-04-08: qty 2
  Filled 2024-04-08: qty 1

## 2024-04-08 MED ORDER — ALUM & MAG HYDROXIDE-SIMETH 200-200-20 MG/5ML PO SUSP: 30.0000 mL | ORAL | Status: DC | PRN

## 2024-04-08 MED ORDER — BUPIVACAINE IN DEXTROSE 0.75-8.25 % IT SOLN
INTRATHECAL | Status: DC | PRN
  Administered 2024-04-08: 1.6 mL via INTRATHECAL

## 2024-04-08 MED ORDER — PROPOFOL 1000 MG/100ML IV EMUL
INTRAVENOUS | Status: AC
  Filled 2024-04-08: qty 100

## 2024-04-08 MED ORDER — DOXYCYCLINE HYCLATE 20 MG PO TABS
20.0000 mg | ORAL_TABLET | Freq: Every day | ORAL | Status: DC
Start: 2024-04-08 — End: 2024-04-12

## 2024-04-08 MED ORDER — ALBUTEROL SULFATE (2.5 MG/3ML) 0.083% IN NEBU: 3.0000 mL | INHALATION_SOLUTION | RESPIRATORY_TRACT | Status: DC | PRN

## 2024-04-08 MED ORDER — 0.9 % SODIUM CHLORIDE (POUR BTL) OPTIME
TOPICAL | Status: DC | PRN
  Administered 2024-04-08: 1000 mL

## 2024-04-08 MED ORDER — HYDROCODONE-ACETAMINOPHEN 7.5-325 MG PO TABS: 1.0000 | ORAL_TABLET | ORAL | Status: DC | PRN

## 2024-04-08 MED ORDER — METOCLOPRAMIDE HCL 5 MG/ML IJ SOLN
5.0000 mg | Freq: Three times a day (TID) | INTRAMUSCULAR | Status: DC | PRN
  Administered 2024-04-08 – 2024-04-10 (×3): 10 mg via INTRAVENOUS
  Filled 2024-04-08 (×3): qty 2

## 2024-04-08 MED ORDER — MIDAZOLAM HCL 2 MG/2ML IJ SOLN
INTRAMUSCULAR | Status: DC | PRN
  Administered 2024-04-08 (×2): 1 mg via INTRAVENOUS

## 2024-04-08 MED ORDER — PANTOPRAZOLE SODIUM 40 MG PO TBEC
40.0000 mg | DELAYED_RELEASE_TABLET | Freq: Every day | ORAL | Status: DC
Start: 2024-04-08 — End: 2024-04-12
  Administered 2024-04-08 – 2024-04-12 (×5): 40 mg via ORAL
  Filled 2024-04-08 (×5): qty 1

## 2024-04-08 MED ORDER — LIDOCAINE HCL (PF) 2 % IJ SOLN
INTRAMUSCULAR | Status: AC
  Filled 2024-04-08: qty 5

## 2024-04-08 MED ORDER — BUPIVACAINE-EPINEPHRINE 0.25% -1:200000 IJ SOLN
INTRAMUSCULAR | Status: DC | PRN
  Administered 2024-04-08: 30 mL

## 2024-04-08 MED ORDER — MIDAZOLAM HCL 2 MG/2ML IJ SOLN
INTRAMUSCULAR | Status: AC
  Filled 2024-04-08: qty 2

## 2024-04-08 MED ORDER — ACETAMINOPHEN 500 MG PO TABS
1000.0000 mg | ORAL_TABLET | Freq: Once | ORAL | Status: AC
  Administered 2024-04-08: 1000 mg via ORAL
  Filled 2024-04-08: qty 2

## 2024-04-08 MED ORDER — TRANEXAMIC ACID-NACL 1000-0.7 MG/100ML-% IV SOLN
1000.0000 mg | INTRAVENOUS | Status: AC
  Administered 2024-04-08: 1000 mg via INTRAVENOUS
  Filled 2024-04-08: qty 100

## 2024-04-08 MED ORDER — PROPOFOL 500 MG/50ML IV EMUL
INTRAVENOUS | Status: DC | PRN
  Administered 2024-04-08: 50 ug/kg/min via INTRAVENOUS

## 2024-04-08 MED ORDER — LISINOPRIL 20 MG PO TABS
40.0000 mg | ORAL_TABLET | Freq: Every morning | ORAL | Status: DC
  Administered 2024-04-09 – 2024-04-12 (×4): 40 mg via ORAL
  Filled 2024-04-08 (×4): qty 2

## 2024-04-08 MED ORDER — CEFAZOLIN SODIUM-DEXTROSE 2-4 GM/100ML-% IV SOLN
2.0000 g | INTRAVENOUS | Status: AC
  Administered 2024-04-08: 2 g via INTRAVENOUS
  Filled 2024-04-08: qty 100

## 2024-04-08 MED ORDER — DROPERIDOL 2.5 MG/ML IJ SOLN: 0.6250 mg | Freq: Once | INTRAMUSCULAR | Status: DC | PRN

## 2024-04-08 MED ORDER — ACETAMINOPHEN 325 MG PO TABS
325.0000 mg | ORAL_TABLET | Freq: Four times a day (QID) | ORAL | Status: DC | PRN
  Administered 2024-04-09: 650 mg via ORAL
  Filled 2024-04-08: qty 2

## 2024-04-08 MED ORDER — ONDANSETRON HCL 4 MG/2ML IJ SOLN
4.0000 mg | Freq: Four times a day (QID) | INTRAMUSCULAR | Status: DC | PRN
  Administered 2024-04-09: 4 mg via INTRAVENOUS
  Filled 2024-04-08: qty 2

## 2024-04-08 MED ORDER — MONTELUKAST SODIUM 10 MG PO TABS
10.0000 mg | ORAL_TABLET | Freq: Every day | ORAL | Status: DC
Start: 2024-04-08 — End: 2024-04-12
  Administered 2024-04-08 – 2024-04-11 (×3): 10 mg via ORAL
  Filled 2024-04-08 (×3): qty 1

## 2024-04-08 MED ORDER — SODIUM CHLORIDE 0.9 % IV SOLN: INTRAVENOUS | Status: AC

## 2024-04-08 MED ORDER — METOPROLOL TARTRATE 25 MG PO TABS
25.0000 mg | ORAL_TABLET | Freq: Two times a day (BID) | ORAL | Status: DC
  Administered 2024-04-10 – 2024-04-12 (×5): 25 mg via ORAL
  Filled 2024-04-08 (×7): qty 1

## 2024-04-08 MED ORDER — CEFAZOLIN SODIUM-DEXTROSE 2-4 GM/100ML-% IV SOLN
2.0000 g | Freq: Four times a day (QID) | INTRAVENOUS | Status: AC
  Administered 2024-04-08 (×2): 2 g via INTRAVENOUS
  Filled 2024-04-08 (×2): qty 100

## 2024-04-08 MED ORDER — BUPIVACAINE-EPINEPHRINE (PF) 0.25% -1:200000 IJ SOLN
INTRAMUSCULAR | Status: AC
  Filled 2024-04-08: qty 30

## 2024-04-08 MED ORDER — ROPIVACAINE HCL 7.5 MG/ML IJ SOLN
INTRAMUSCULAR | Status: DC | PRN
  Administered 2024-04-08: 20 mL via PERINEURAL

## 2024-04-08 MED ORDER — SODIUM CHLORIDE 0.9 % IR SOLN
Status: DC | PRN
  Administered 2024-04-08: 1000 mL

## 2024-04-08 MED ORDER — PHENYLEPHRINE HCL-NACL 20-0.9 MG/250ML-% IV SOLN
INTRAVENOUS | Status: DC | PRN
  Administered 2024-04-08: 50 ug/min via INTRAVENOUS

## 2024-04-08 MED ORDER — ONDANSETRON HCL 4 MG/2ML IJ SOLN
INTRAMUSCULAR | Status: AC
  Filled 2024-04-08: qty 2

## 2024-04-08 MED ORDER — DOCUSATE SODIUM 100 MG PO CAPS
100.0000 mg | ORAL_CAPSULE | Freq: Two times a day (BID) | ORAL | Status: DC
  Administered 2024-04-08 – 2024-04-12 (×8): 100 mg via ORAL
  Filled 2024-04-08 (×8): qty 1

## 2024-04-08 MED ORDER — SERTRALINE HCL 50 MG PO TABS
50.0000 mg | ORAL_TABLET | Freq: Every day | ORAL | Status: DC
  Administered 2024-04-08 – 2024-04-11 (×4): 50 mg via ORAL
  Filled 2024-04-08 (×4): qty 1

## 2024-04-08 MED ORDER — MENTHOL 3 MG MT LOZG: 1.0000 | LOZENGE | OROMUCOSAL | Status: DC | PRN

## 2024-04-08 MED ORDER — ALBUTEROL SULFATE HFA 108 (90 BASE) MCG/ACT IN AERS
2.0000 | INHALATION_SPRAY | RESPIRATORY_TRACT | Status: DC | PRN
  Administered 2024-04-08: 2 via RESPIRATORY_TRACT

## 2024-04-08 MED ORDER — FENTANYL CITRATE (PF) 100 MCG/2ML IJ SOLN
INTRAMUSCULAR | Status: AC
  Filled 2024-04-08: qty 2

## 2024-04-08 MED ORDER — ONDANSETRON HCL 4 MG/2ML IJ SOLN
INTRAMUSCULAR | Status: DC | PRN
  Administered 2024-04-08: 4 mg via INTRAVENOUS

## 2024-04-08 MED ORDER — METHOCARBAMOL 500 MG PO TABS
500.0000 mg | ORAL_TABLET | Freq: Four times a day (QID) | ORAL | Status: DC | PRN
  Administered 2024-04-09 – 2024-04-11 (×4): 500 mg via ORAL
  Filled 2024-04-08 (×4): qty 1

## 2024-04-08 MED ORDER — CLONIDINE HCL (ANALGESIA) 100 MCG/ML EP SOLN
EPIDURAL | Status: DC | PRN
  Administered 2024-04-08: 70 ug

## 2024-04-08 SURGICAL SUPPLY — 51 items
BAG COUNTER SPONGE SURGICOUNT (BAG) IMPLANT
BAG ZIPLOCK 12X15 (MISCELLANEOUS) ×1 IMPLANT
BLADE SAG 18X100X1.27 (BLADE) ×1 IMPLANT
BLADE SAW SGTL 11.0X1.19X90.0M (BLADE) IMPLANT
BNDG ELASTIC 6INX 5YD STR LF (GAUZE/BANDAGES/DRESSINGS) ×2 IMPLANT
BOWL SMART MIX CTS (DISPOSABLE) IMPLANT
CEMENT BONE R 1X40 (Cement) IMPLANT
COMP TIB PS KNEE D 0D LT (Joint) ×1 IMPLANT
COMPONET TIB PS KNEE D 0D LT (Joint) IMPLANT
COOLER ICEMAN CLASSIC (MISCELLANEOUS) ×1 IMPLANT
COVER SURGICAL LIGHT HANDLE (MISCELLANEOUS) ×1 IMPLANT
CUFF TRNQT CYL 34X4.125X (TOURNIQUET CUFF) ×1 IMPLANT
DRAPE INCISE IOBAN 66X45 STRL (DRAPES) ×1 IMPLANT
DRAPE U-SHAPE 47X51 STRL (DRAPES) ×1 IMPLANT
DURAPREP 26ML APPLICATOR (WOUND CARE) ×1 IMPLANT
ELECT BLADE TIP CTD 4 INCH (ELECTRODE) ×1 IMPLANT
ELECT PENCIL ROCKER SW 15FT (MISCELLANEOUS) ×1 IMPLANT
ELECT REM PT RETURN 15FT ADLT (MISCELLANEOUS) ×1 IMPLANT
FEMUR CMT CR STD SZ 6 LT KNEE (Joint) ×1 IMPLANT
FEMUR CMTD CR STD SZ 6 LT KNEE (Joint) IMPLANT
GAUZE PAD ABD 8X10 STRL (GAUZE/BANDAGES/DRESSINGS) ×2 IMPLANT
GAUZE SPONGE 4X4 12PLY STRL (GAUZE/BANDAGES/DRESSINGS) ×1 IMPLANT
GAUZE XEROFORM 1X8 LF (GAUZE/BANDAGES/DRESSINGS) IMPLANT
GLOVE BIO SURGEON STRL SZ7.5 (GLOVE) ×1 IMPLANT
GLOVE BIOGEL PI IND STRL 8 (GLOVE) ×2 IMPLANT
GLOVE ECLIPSE 8.0 STRL XLNG CF (GLOVE) ×1 IMPLANT
GOWN STRL REUS W/ TWL XL LVL3 (GOWN DISPOSABLE) ×2 IMPLANT
HOLDER FOLEY CATH W/STRAP (MISCELLANEOUS) IMPLANT
IMMOBILIZER KNEE 20 (SOFTGOODS) ×1 IMPLANT
IMMOBILIZER KNEE 20 THIGH 36 (SOFTGOODS) ×1 IMPLANT
INSERT TIB ASF 14 6-7/CD LT (Insert) IMPLANT
KIT TURNOVER KIT A (KITS) IMPLANT
MANIFOLD NEPTUNE II (INSTRUMENTS) ×1 IMPLANT
NS IRRIG 1000ML POUR BTL (IV SOLUTION) ×1 IMPLANT
PACK TOTAL KNEE CUSTOM (KITS) ×1 IMPLANT
PAD COLD SHLDR WRAP-ON (PAD) ×1 IMPLANT
PADDING CAST COTTON 6X4 STRL (CAST SUPPLIES) ×2 IMPLANT
PIN DRILL HDLS TROCAR 75 4PK (PIN) IMPLANT
PROTECTOR NERVE ULNAR (MISCELLANEOUS) ×1 IMPLANT
SCREW FEMALE HEX FIX 25X2.5 (ORTHOPEDIC DISPOSABLE SUPPLIES) IMPLANT
SET HNDPC FAN SPRY TIP SCT (DISPOSABLE) ×1 IMPLANT
SET PAD KNEE POSITIONER (MISCELLANEOUS) ×1 IMPLANT
STAPLER SKIN PROX WIDE 3.9 (STAPLE) IMPLANT
STEM POLY PAT PLY 32M KNEE (Knees) IMPLANT
SUT VIC AB 0 CT1 36 (SUTURE) ×2 IMPLANT
SUT VIC AB 1 CT1 36 (SUTURE) ×2 IMPLANT
SUT VIC AB 2-0 CT1 TAPERPNT 27 (SUTURE) ×2 IMPLANT
TOWEL GREEN STERILE FF (TOWEL DISPOSABLE) ×1 IMPLANT
TRAY FOLEY MTR SLVR 14FR STAT (SET/KITS/TRAYS/PACK) IMPLANT
WATER STERILE IRR 1000ML POUR (IV SOLUTION) ×2 IMPLANT
YANKAUER SUCT BULB TIP NO VENT (SUCTIONS) ×1 IMPLANT

## 2024-04-08 NOTE — Plan of Care (Signed)
  Problem: Education: Goal: Knowledge of General Education information will improve Description: Including pain rating scale, medication(s)/side effects and non-pharmacologic comfort measures Outcome: Progressing   Problem: Health Behavior/Discharge Planning: Goal: Ability to manage health-related needs will improve Outcome: Progressing   Problem: Clinical Measurements: Goal: Ability to maintain clinical measurements within normal limits will improve Outcome: Progressing Goal: Will remain free from infection Outcome: Progressing Goal: Diagnostic test results will improve Outcome: Progressing Goal: Respiratory complications will improve Outcome: Progressing Goal: Cardiovascular complication will be avoided Outcome: Progressing   Problem: Activity: Goal: Risk for activity intolerance will decrease Outcome: Adequate for Discharge   Problem: Nutrition: Goal: Adequate nutrition will be maintained Outcome: Completed/Met   Problem: Coping: Goal: Level of anxiety will decrease Outcome: Progressing   Problem: Elimination: Goal: Will not experience complications related to bowel motility Outcome: Progressing Goal: Will not experience complications related to urinary retention Outcome: Progressing   Problem: Pain Managment: Goal: General experience of comfort will improve and/or be controlled Outcome: Progressing   Problem: Safety: Goal: Ability to remain free from injury will improve Outcome: Progressing   Problem: Skin Integrity: Goal: Risk for impaired skin integrity will decrease Outcome: Progressing   Problem: Education: Goal: Knowledge of the prescribed therapeutic regimen will improve Outcome: Progressing Goal: Individualized Educational Video(s) Outcome: Completed/Met   Problem: Activity: Goal: Ability to avoid complications of mobility impairment will improve Outcome: Adequate for Discharge Goal: Range of joint motion will improve Outcome: Adequate for  Discharge   Problem: Clinical Measurements: Goal: Postoperative complications will be avoided or minimized Outcome: Progressing   Problem: Pain Management: Goal: Pain level will decrease with appropriate interventions Outcome: Progressing   Problem: Skin Integrity: Goal: Will show signs of wound healing Outcome: Progressing

## 2024-04-08 NOTE — Transfer of Care (Signed)
 Immediate Anesthesia Transfer of Care Note  Patient: Brooke Cole  Procedure(s) Performed: ARTHROPLASTY, KNEE, TOTAL (Left: Knee)  Patient Location: PACU  Anesthesia Type:Spinal  Level of Consciousness: awake and alert   Airway & Oxygen Therapy: Patient Spontanous Breathing and Patient connected to face mask oxygen  Post-op Assessment: Report given to RN and Post -op Vital signs reviewed and stable  Post vital signs: Reviewed and stable  Last Vitals:  Vitals Value Taken Time  BP 103/54 04/08/24 0917  Temp    Pulse 56 04/08/24 0919  Resp    SpO2 100 % 04/08/24 0919  Vitals shown include unfiled device data.  Last Pain:  Vitals:   04/08/24 0608  TempSrc: Oral         Complications: No notable events documented.

## 2024-04-08 NOTE — TOC Transition Note (Signed)
 Transition of Care [facility]) - Discharge Note   Patient Details  Name: Brooke Cole MRN: [id number] Date of Birth: Dec 29, 1946  [facility] CM/SW Contact:  Amada Jupiter, LCSW Phone Number: 04/08/2024, 2:56 PM   Clinical Narrative:     Met with pt who confirms she has needed DME in the home.  HHPT prearranged with [facility] via ortho MD office prior to surgery.  No further TOC needs.  Final next level of care: Home w Home Health Services Barriers to Discharge: No Barriers Identified   Patient Goals and CMS Choice Patient states their goals for this hospitalization and ongoing recovery are:: return home          Discharge Placement                       Discharge Plan and Services Additional resources added to the After Visit Summary for                  DME Arranged: N/A DME Agency: NA       HH Arranged: PT HH Agency: [facility]        Social Drivers of Health (SDOH) Interventions SDOH Screenings   Food Insecurity: No Food Insecurity (04/08/2024)  Housing: Low Risk  (04/08/2024)  Transportation Needs: No Transportation Needs (04/08/2024)  Utilities: Not At Risk (04/08/2024)  Social Connections: Moderately Integrated (04/08/2024)  Tobacco Use: Low Risk  (04/08/2024)     Readmission Risk Interventions     No data to display

## 2024-04-08 NOTE — Anesthesia Procedure Notes (Addendum)
 Spinal  Patient location during procedure: OR Start time: 04/08/2024 7:23 AM End time: 04/08/2024 7:31 AM Reason for block: surgical anesthesia Staffing Performed: anesthesiologist  Anesthesiologist: Lewie Loron, MD Performed by: Lewie Loron, MD Authorized by: Lewie Loron, MD   Preanesthetic Checklist Completed: patient identified, IV checked, site marked, risks and benefits discussed, surgical consent, monitors and equipment checked, pre-op evaluation and timeout performed Spinal Block Patient position: sitting Prep: DuraPrep and site prepped and draped Patient monitoring: heart rate, continuous pulse ox and blood pressure Approach: midline Location: L3-4 Injection technique: single-shot Needle Needle type: Spinocan  Needle gauge: 25 G Needle length: 9 cm Additional Notes Expiration date of kit checked and confirmed. Patient tolerated procedure well, without complications.

## 2024-04-08 NOTE — Anesthesia Procedure Notes (Signed)
 Date/Time: 04/08/2024 7:23 AM  Performed by: Florene Route, CRNAOxygen Delivery Method: Simple face mask

## 2024-04-08 NOTE — Anesthesia Procedure Notes (Signed)
 Anesthesia Regional Block: Adductor canal block   Pre-Anesthetic Checklist: , timeout performed,  Correct Patient, Correct Site, Correct Laterality,  Correct Procedure, Correct Position, site marked,  Risks and benefits discussed,  Surgical consent,  Pre-op evaluation,  At surgeon's request and post-op pain management  Laterality: Lower and Left  Prep: chloraprep       Needles:  Injection technique: Single-shot  Needle Type: Stimiplex     Needle Length: 9cm  Needle Gauge: 21     Additional Needles:   Procedures:,,,, ultrasound used (permanent image in chart),,    Narrative:  Start time: 04/08/2024 6:50 AM End time: 04/08/2024 7:09 AM Injection made incrementally with aspirations every 5 mL.  Performed by: Personally  Anesthesiologist: Lewie Loron, MD  Additional Notes: BP cuff, EKG monitors applied. Sedation begun. Artery and nerve location verified with ultrasound. Anesthetic injected incrementally (5ml), slowly, and after negative aspirations under direct u/s guidance. Good fascial/perineural spread. Tolerated well.

## 2024-04-08 NOTE — Op Note (Signed)
 Operative Note  Date of operation: 04/08/2024 Preoperative diagnosis: Left knee primary osteoarthritis Postoperative diagnosis: Same  Procedure: Left cemented total knee arthroplasty  Implants: Biomet/Zimmer persona cemented knee system Implant Name Type Inv. Item Serial No. Manufacturer Lot No. LRB No. Used Action  CEMENT BONE R 1X40 - [id number] Cement CEMENT BONE R 1X40  ZIMMER RECON(ORTH,TRAU,BIO,SG) [id number] Left 2 Implanted  COMP TIB PS KNEE D 0D LT - [id number] Joint COMP TIB PS KNEE D 0D LT  ZIMMER RECON(ORTH,TRAU,BIO,SG) [id number] Left 1 Implanted  STEM POLY PAT PLY 58M KNEE - [id number] Knees STEM POLY PAT PLY 58M KNEE  ZIMMER RECON(ORTH,TRAU,BIO,SG) [id number] Left 1 Implanted  INSERT TIB ASF 14 6-7/CD LT - [id number] Insert INSERT TIB ASF 14 6-7/CD LT  ZIMMER RECON(ORTH,TRAU,BIO,SG) [id number] Left 1 Implanted  FEMUR CMT CR STD SZ 6 LT KNEE - [id number] Joint FEMUR CMT CR STD SZ 6 LT KNEE  ZIMMER RECON(ORTH,TRAU,BIO,SG) [id number] Left 1 Implanted   Surgeon: Vanita Panda. Magnus Ivan, MD Assistant: Rexene Edison, PA-C  Anesthesia: #1 left lower extremity adductor canal block, #2 spinal, #3 local Tourniquet time: Under 1 hour EBL: Less than 50 cc Antibiotics: IV Ancef Complications: None  Indications: The patient is a 78 year old female with debilitating arthritis involving her left knee.  We actually replaced her right knee several years back and that is done well.  At this point her left knee pain has become daily and it is detrimentally affecting her mobility, her quality of life and her actives daily living.  She does wish to proceed with a total knee arthroplasty at this standpoint.  Having had this before she is fully aware of the risk of acute blood loss anemia, nerve vessel injury, fracture, infection, DVT, implant failure and wound healing issues.  She understands that our goals are hopefully decreased pain, improved mobility, and improved quality of life.  Procedure  description: After informed consent was obtained and the appropriate left knee was marked, anesthesia obtained a left lower extremity regional block in the holding room.  The patient was then brought to the operating room and set up on the) table spinal anesthesia was obtained.  She was laid in supine position on the operating table and a Foley catheter was placed.  A nonsterile tractors placed around her upper left thigh and her left thigh, knee, leg and ankle were prepped and draped with DuraPrep and sterile drapes including a sterile stockinette.  A timeout was called and she was identified as the correct patient and the correct left knee.  An Esmarch was then used to wrap out the leg and the tourniquet was inflated to 300 mm of pressure.  With the knee extended a direct midline incision was made over the patella and carried proximally distally.  Dissection was carried down to the knee joint and a medial parapatellar arthrotomy was made.  With the knee in a flexed position we removed osteophytes in all compartments and found significant cartilage loss.  We removed remnants of the ACL as well as medial and lateral meniscus.  We then used extramedullary cutting guide for making her proximal tibia cut with correction for varus and valgus and a 7 degree slope.  We made this cut to take 2 mm off the low side and we did backed this down to more millimeters.  We then used a intramedullary based cutting guide for our distal femur cut setting this for a left knee at 5 degrees externally rotated and we made this cut without difficulty and brought the  knee back down to full extension and with a 10 mm extension block she is slightly hyperextended.  We then backed the femur and put a femoral sizing guide based off the epicondylar axis.  Based off of this we chose a size 6 femur.  We put a 4-in-1 cutting block for a size 6 femur and made our anterior and posterior cuts followed by her chamfer cuts.  We doing back to the tibia  and chose a size D left tibial tray for coverage over the tibial plateau setting the rotation off of the tibial tubercle and the femur.  We did our drill hole and keel punch over this.  We then trialed our size D left tibia combined with our size 6 left CR standard femur.  We trialed up to a 14 mm thickness medial congruent left polythene insert we are pleased with range of motion and stability without insert.  We then made a patella cut and drilled 3 holes for a size 32 patella button.  Again we are pleased with range of motion and stability with all trial instrumentation in the knee.  We then removed all trial instrumentation and components and irrigate the knee with normal saline solution.  We then placed Marcaine with epinephrine around the arthrotomy.  Next we mixed our cement and dried the knee real well.  With the knee in a flexed position we then cemented our Biomet/Zimmer persona tibial tray for a left knee size D followed by cementing our size 6 left CR standard femur.  We made sure to remove any excess cement debris from the knee and placed our 14 mm thickness medial congruent left polythene insert.  We then cemented our size 32 patella button.  We then held the knee compressed and fully extended for the cement to harden.  Once it hardened we let the tourniquet down and hemostasis was obtained with electrocautery.  The arthrotomy was then closed with interrupted #1 Vicryl suture followed by 0 Vicryl goes deep tissue and 2-0 Vicryl close subcutaneous tissue.  The skin was closed with staples.  Well-padded sterile dressing is applied.  The patient was taken the recovery room in stable condition.  Rexene Edison, PA-C did assist during the entire case and beginning and his assistance was crucial and medically necessary for soft tissue management and retraction, helping guide implant placement and a layered closure of the wound.

## 2024-04-08 NOTE — Evaluation (Signed)
 Physical Therapy Evaluation Patient Details Name: Brooke Cole MRN: [id number] DOB: 11/02/1946 Today's Date: 04/08/2024  History of Present Illness  Pt s/p L TKR and with hx of R TKR and CKD  Clinical Impression  Pt s/p L TKR and presents with decreased L LE strength/ROM and post op pain limiting functional mobility.  Pt should progress well to dc home with family assist and reports follow up HHPT planned.        If plan is discharge home, recommend the following: A little help with walking and/or transfers;A little help with bathing/dressing/bathroom;Assistance with cooking/housework;Assist for transportation;Help with stairs or ramp for entrance   Can travel by private vehicle        Equipment Recommendations None recommended by PT  Recommendations for Other Services       Functional Status Assessment Patient has had a recent decline in their functional status and demonstrates the ability to make significant improvements in function in a reasonable and predictable amount of time.     Precautions / Restrictions Precautions Precautions: Fall;Knee Recall of Precautions/Restrictions: Intact Restrictions Weight Bearing Restrictions Per Provider Order: No Other Position/Activity Restrictions: WBAT      Mobility  Bed Mobility Overal bed mobility: Needs Assistance Bed Mobility: Supine to Sit     Supine to sit: Min assist     General bed mobility comments: cues for sequence and use of R LE to self assist    Transfers Overall transfer level: Needs assistance Equipment used: Rolling walker (2 wheels) Transfers: Sit to/from Stand Sit to Stand: Min assist           General transfer comment: cues for LE management and use of UEs to self assist    Ambulation/Gait Ambulation/Gait assistance: Min assist Gait Distance (Feet): 48 Feet Assistive device: Rolling walker (2 wheels) Gait Pattern/deviations: Step-to pattern, Decreased step length - right, Decreased step  length - left, Shuffle, Trunk flexed Gait velocity: decr     General Gait Details: INcreased time with cues for sequence, posture and position from [employer]            Wheelchair Mobility     Tilt Bed    Modified Rankin (Stroke Patients Only)       Balance Overall balance assessment: Mild deficits observed, not formally tested                                           Pertinent Vitals/Pain Pain Assessment Pain Assessment: 0-10 Pain Score: 5  Pain Location: L knee Pain Descriptors / Indicators: Aching, Sore Pain Intervention(s): Limited activity within patient's tolerance, Monitored during session, Ice applied, RN gave pain meds during session    Home Living Family/patient expects to be discharged to:: Private residence Living Arrangements: Alone Available Help at Discharge: Family;Available 24 hours/day Type of Home: House Home Access: Level entry       Home Layout: One level Home Equipment: Agricultural consultant (2 wheels);BSC/3in1      Prior Function Prior Level of Function : Independent/Modified Independent                     Extremity/Trunk Assessment   Upper Extremity Assessment Upper Extremity Assessment: Overall WFL for tasks assessed    Lower Extremity Assessment Lower Extremity Assessment: LLE deficits/detail    Cervical / Trunk Assessment Cervical / Trunk Assessment: Normal  Communication   Communication  Communication: No apparent difficulties    Cognition Arousal: Alert Behavior During Therapy: WFL for tasks assessed/performed   PT - Cognitive impairments: No apparent impairments                         Following commands: Intact       Cueing       General Comments      Exercises Total Joint Exercises Ankle Circles/Pumps: AROM, Both, 10 reps, Supine   Assessment/Plan    PT Assessment Patient needs continued PT services  PT Problem List Decreased strength;Decreased range of  motion;Decreased activity tolerance;Decreased balance;Decreased mobility;Decreased knowledge of use of DME;Pain       PT Treatment Interventions DME instruction;Gait training;Stair training;Functional mobility training;Therapeutic activities;Therapeutic exercise;Balance training;Patient/family education    PT Goals (Current goals can be found in the Care Plan section)  Acute Rehab PT Goals Patient Stated Goal: Regain IND PT Goal Formulation: With patient Time For Goal Achievement: 04/15/24 Potential to Achieve Goals: Good    Frequency 7X/week     Co-evaluation               AM-PAC PT "6 Clicks" Mobility  Outcome Measure Help needed turning from your back to your side while in a flat bed without using bedrails?: A Little Help needed moving from lying on your back to sitting on the side of a flat bed without using bedrails?: A Little Help needed moving to and from a bed to a chair (including a wheelchair)?: A Little Help needed standing up from a chair using your arms (e.g., wheelchair or bedside chair)?: A Little Help needed to walk in hospital room?: A Little Help needed climbing 3-5 steps with a railing? : A Lot 6 Click Score: 17    End of Session Equipment Utilized During Treatment: Gait belt;Left knee immobilizer Activity Tolerance: Patient tolerated treatment well Patient left: in chair;with call bell/phone within reach;with chair alarm set;with family/visitor present Nurse Communication: Mobility status PT Visit Diagnosis: Difficulty in walking, not elsewhere classified (R26.2)    Time: 1423-1500 PT Time Calculation (min) (ACUTE ONLY): 37 min   Charges:   PT Evaluation $PT Eval Low Complexity: 1 Low PT Treatments $Gait Training: 8-22 mins PT General Charges $$ ACUTE PT VISIT: 1 Visit         Brooke Cole PT Acute Rehabilitation Services Pager [phone] Office [phone]   Brooke Cole 04/08/2024, 3:05 PM

## 2024-04-08 NOTE — Interval H&P Note (Signed)
 History and Physical Interval Note: The patient understands that she is here today for a left total knee replacement to treat her severe left knee pain and arthritis.  There has been no significant change in her medical status.  There is been no acute changes.  The risks and benefits of surgery have been discussed in detail and informed consent has been obtained.  The left operative knee has been marked.  04/08/2024 6:55 AM  Brooke Cole  has presented today for surgery, with the diagnosis of left knee osteoarthritis.  The various methods of treatment have been discussed with the patient and family. After consideration of risks, benefits and other options for treatment, the patient has consented to  Procedure(s): ARTHROPLASTY, KNEE, TOTAL (Left) as a surgical intervention.  The patient's history has been reviewed, patient examined, no change in status, stable for surgery.  I have reviewed the patient's chart and labs.  Questions were answered to the patient's satisfaction.     Kathryne Hitch

## 2024-04-08 NOTE — Anesthesia Postprocedure Evaluation (Signed)
 Anesthesia Post Note  Patient: BERNADETTE GORES  Procedure(s) Performed: ARTHROPLASTY, KNEE, TOTAL (Left: Knee)     Patient location during evaluation: PACU Anesthesia Type: Spinal Level of consciousness: sedated and patient cooperative Pain management: pain level controlled Vital Signs Assessment: post-procedure vital signs reviewed and stable Respiratory status: spontaneous breathing Cardiovascular status: stable Anesthetic complications: no   No notable events documented.  Last Vitals:  Vitals:   04/08/24 1316 04/08/24 1531  BP: (!) 101/51 (!) 101/54  Pulse: (!) 52 (!) 51  Resp: 16 16  Temp: (!) 36.4 C 36.6 C  SpO2: 94% 96%    Last Pain:  Vitals:   04/08/24 1531  TempSrc: Oral  PainSc:     LLE Motor Response: Purposeful movement (04/08/24 1646) LLE Sensation: Full sensation (04/08/24 1646) RLE Motor Response: Purposeful movement (04/08/24 1646) RLE Sensation: Full sensation (04/08/24 1646)      Lewie Loron

## 2024-04-09 LAB — BASIC METABOLIC PANEL WITH GFR
Anion gap: 5 (ref 5–15)
BUN: 23 mg/dL (ref 8–23)
CO2: 25 mmol/L (ref 22–32)
Calcium: 8.5 mg/dL — ABNORMAL LOW (ref 8.9–10.3)
Chloride: 106 mmol/L (ref 98–111)
Creatinine, Ser: 0.68 mg/dL (ref 0.44–1.00)
GFR, Estimated: >60 mL/min (ref >60)
Glucose, Bld: 144 mg/dL — ABNORMAL HIGH (ref 70–99)
Potassium: 3.9 mmol/L (ref 3.5–5.1)
Sodium: 136 mmol/L (ref 135–145)

## 2024-04-09 LAB — CBC
HCT: 32.3 % — ABNORMAL LOW (ref 36.0–46.0)
Hemoglobin: 10 g/dL — ABNORMAL LOW (ref 12.0–15.0)
MCH: 30.7 pg (ref 26.0–34.0)
MCHC: 31 g/dL (ref 30.0–36.0)
MCV: 99.1 fL (ref 80.0–100.0)
Platelets: 127 10*3/uL — ABNORMAL LOW (ref 150–400)
RBC: 3.26 MIL/uL — ABNORMAL LOW (ref 3.87–5.11)
RDW: 13.1 % (ref 11.5–15.5)
WBC: 7.8 10*3/uL (ref 4.0–10.5)
nRBC: 0 % (ref 0.0–0.2)

## 2024-04-09 MED ORDER — TRAMADOL HCL 50 MG PO TABS
100.0000 mg | ORAL_TABLET | Freq: Four times a day (QID) | ORAL | Status: DC | PRN
  Administered 2024-04-09 – 2024-04-12 (×8): 100 mg via ORAL
  Filled 2024-04-09 (×8): qty 2

## 2024-04-09 MED ORDER — ASPIRIN 81 MG PO CHEW: 81.0000 mg | CHEWABLE_TABLET | Freq: Two times a day (BID) | ORAL | 0 refills | Status: DC

## 2024-04-09 MED ORDER — METHOCARBAMOL 500 MG PO TABS: 500.0000 mg | ORAL_TABLET | Freq: Four times a day (QID) | ORAL | 1 refills | Status: DC | PRN

## 2024-04-09 MED ORDER — ONDANSETRON 4 MG PO TBDP: 4.0000 mg | ORAL_TABLET | Freq: Three times a day (TID) | ORAL | 0 refills | Status: AC | PRN

## 2024-04-09 MED ORDER — PROCHLORPERAZINE EDISYLATE 10 MG/2ML IJ SOLN
5.0000 mg | INTRAMUSCULAR | Status: DC | PRN
  Administered 2024-04-09 – 2024-04-11 (×6): 5 mg via INTRAVENOUS
  Filled 2024-04-09 (×7): qty 2

## 2024-04-09 MED ORDER — TRAMADOL HCL 50 MG PO TABS
50.0000 mg | ORAL_TABLET | Freq: Four times a day (QID) | ORAL | 0 refills | Status: DC | PRN
Start: 2024-04-09 — End: 2024-04-11

## 2024-04-09 NOTE — Progress Notes (Signed)
 Physical Therapy Treatment Patient Details Name: Brooke Cole MRN: [id number] DOB: [birth year] Today's Date: 04/09/2024   History of Present Illness Pt s/p L TKR and with hx of R TKR and CKD    PT Comments  Pt performed therex program with assist but OOB deferred at this time 2* ongoing nausea.      If plan is discharge home, recommend the following: A little help with walking and/or transfers;A little help with bathing/dressing/bathroom;Assistance with cooking/housework;Assist for transportation;Help with stairs or ramp for entrance   Can travel by private vehicle        Equipment Recommendations  None recommended by PT    Recommendations for Other Services       Precautions / Restrictions Precautions Precautions: Fall;Knee Recall of Precautions/Restrictions: Intact Restrictions Weight Bearing Restrictions Per Provider Order: No Other Position/Activity Restrictions: WBAT     Mobility  Bed Mobility                    Transfers                        Ambulation/Gait                   Stairs             Wheelchair Mobility     Tilt Bed    Modified Rankin (Stroke Patients Only)       Balance                                            Communication Communication Communication: No apparent difficulties  Cognition Arousal: Alert Behavior During Therapy: WFL for tasks assessed/performed   PT - Cognitive impairments: No apparent impairments                         Following commands: Intact      Cueing    Exercises Total Joint Exercises Ankle Circles/Pumps: AROM, Both, 10 reps, Supine Quad Sets: AROM, Both, 10 reps, Supine Heel Slides: AAROM, Left, 10 reps, Supine Straight Leg Raises: AAROM, AROM, Left, 10 reps, Supine Goniometric ROM: AAROM L knee -5 - 30 - pain limited    General Comments        Pertinent Vitals/Pain Pain Assessment Pain Assessment: 0-10 Pain Score: 6  Pain  Location: L knee Pain Descriptors / Indicators: Aching, Sore Pain Intervention(s): Limited activity within patient's tolerance, Monitored during session, Premedicated before session, Ice applied    Home Living                          Prior Function            PT Goals (current goals can now be found in the care plan section) Acute Rehab PT Goals Patient Stated Goal: Regain IND PT Goal Formulation: With patient Time For Goal Achievement: 04/15/24 Potential to Achieve Goals: Good Progress towards PT goals: Not progressing toward goals - comment (nausea)    Frequency    7X/week      PT Plan      Co-evaluation              AM-PAC PT "6 Clicks" Mobility   Outcome Measure  Help needed turning from your back to your side while in a flat bed without  using bedrails?: A Little Help needed moving from lying on your back to sitting on the side of a flat bed without using bedrails?: A Little Help needed moving to and from a bed to a chair (including a wheelchair)?: A Little Help needed standing up from a chair using your arms (e.g., wheelchair or bedside chair)?: A Little Help needed to walk in hospital room?: A Little Help needed climbing 3-5 steps with a railing? : A Lot 6 Click Score: 17    End of Session   Activity Tolerance: Patient limited by fatigue;Patient limited by pain;Other (comment) (nausea) Patient left: in bed;with call bell/phone within reach;with bed alarm set;with family/visitor present Nurse Communication: Mobility status PT Visit Diagnosis: Difficulty in walking, not elsewhere classified (R26.2)     Time: 9562-1308 PT Time Calculation (min) (ACUTE ONLY): 22 min  Charges:    $Therapeutic Exercise: 8-22 mins PT General Charges $$ ACUTE PT VISIT: 1 Visit                     Thedora Finlay PT Acute Rehabilitation Services Pager [phone] Office [phone]    Kealan Buchan 04/09/2024, 1:09 PM

## 2024-04-09 NOTE — Discharge Instructions (Signed)

## 2024-04-09 NOTE — Plan of Care (Signed)
  Problem: Activity: Goal: Risk for activity intolerance will decrease Outcome: Progressing   Problem: Coping: Goal: Level of anxiety will decrease Outcome: Progressing   Problem: Pain Managment: Goal: General experience of comfort will improve and/or be controlled Outcome: Progressing   Problem: Safety: Goal: Ability to remain free from injury will improve Outcome: Progressing

## 2024-04-09 NOTE — Progress Notes (Signed)
 Subjective: 1 Day Post-Op Procedure(s) (LRB): ARTHROPLASTY, KNEE, TOTAL (Left) Patient reports pain as moderate.  Pain medications tend to make her significantly nauseous.  When we replaced her other knee many years ago she ended up back in the hospital due to nausea and vomiting.  Physical therapy has worked with her today and recommends her staying here another day to maximize therapy.  Objective: Vital signs in last 24 hours: Temp:  [97.5 F (36.4 C)-99.2 F (37.3 C)] 98.5 F (36.9 C) (04/12 1003) Pulse Rate:  [51-62] 52 (04/12 1003) Resp:  [14-18] 18 (04/12 1003) BP: (101-110)/(46-71) 102/46 (04/12 1003) SpO2:  [91 %-98 %] 91 % (04/12 1003)  Intake/Output from previous day: 04/11 0701 - 04/12 0700 In: 3129.9 [P.O.:660; I.V.:2069.9; IV Piggyback:400] Out: 2300 [Urine:2250; Blood:50] Intake/Output this shift: No intake/output data recorded.  Recent Labs    04/09/24 0331  HGB 10.0*   Recent Labs    04/09/24 0331  WBC 7.8  RBC 3.26*  HCT 32.3*  PLT 127*   Recent Labs    04/09/24 0331  NA 136  K 3.9  CL 106  CO2 25  BUN 23  CREATININE 0.68  GLUCOSE 144*  CALCIUM 8.5*   No results for input(s): "LABPT", "INR" in the last 72 hours.  Sensation intact distally Intact pulses distally Dorsiflexion/Plantar flexion intact Incision: dressing C/D/I Compartment soft   Assessment/Plan: 1 Day Post-Op Procedure(s) (LRB): ARTHROPLASTY, KNEE, TOTAL (Left) Up with therapy Plan for discharge tomorrow Discharge home with home health      Arnie Lao 04/09/2024, 11:21 AM

## 2024-04-09 NOTE — Progress Notes (Signed)
 Physical Therapy Treatment Patient Details Name: Brooke Cole MRN: [id number] DOB: [birth year] Today's Date: 04/09/2024   History of Present Illness Pt s/p L TKR and with hx of R TKR and CKD    PT Comments  Pt feeling better and up to ambulate limited distance in hall and up to bathroom for toileting.  Pt with c/o feeling "woozy" with BP 100/49 and assisted to bed.    If plan is discharge home, recommend the following: A little help with walking and/or transfers;A little help with bathing/dressing/bathroom;Assistance with cooking/housework;Assist for transportation;Help with stairs or ramp for entrance   Can travel by private vehicle        Equipment Recommendations  None recommended by PT    Recommendations for Other Services       Precautions / Restrictions Precautions Precautions: Fall;Knee Recall of Precautions/Restrictions: Intact Restrictions Weight Bearing Restrictions Per Provider Order: No Other Position/Activity Restrictions: WBAT     Mobility  Bed Mobility Overal bed mobility: Needs Assistance Bed Mobility: Supine to Sit, Sit to Supine     Supine to sit: Min assist Sit to supine: Min assist   General bed mobility comments: cues for sequence and use of R LE to self assist    Transfers Overall transfer level: Needs assistance Equipment used: Rolling walker (2 wheels) Transfers: Sit to/from Stand Sit to Stand: Min assist           General transfer comment: cues for LE management and use of UEs to self assist    Ambulation/Gait Ambulation/Gait assistance: Min assist Gait Distance (Feet): 48 Feet (and additional 15' from bathroom) Assistive device: Rolling walker (2 wheels) Gait Pattern/deviations: Step-to pattern, Decreased step length - right, Decreased step length - left, Shuffle, Trunk flexed Gait velocity: decr     General Gait Details: INcreased time with cues for sequence, posture and position from [employer]              Wheelchair Mobility     Tilt Bed    Modified Rankin (Stroke Patients Only)       Balance Overall balance assessment: Mild deficits observed, not formally tested                                          Communication Communication Communication: No apparent difficulties  Cognition Arousal: Alert Behavior During Therapy: WFL for tasks assessed/performed   PT - Cognitive impairments: No apparent impairments                         Following commands: Intact      Cueing    Exercises Total Joint Exercises Ankle Circles/Pumps: AROM, Both, 10 reps, Supine Quad Sets: AROM, Both, 10 reps, Supine Heel Slides: AAROM, Left, 10 reps, Supine Straight Leg Raises: AAROM, AROM, Left, 10 reps, Supine Goniometric ROM: AAROM L knee -5 - 30 - pain limited    General Comments        Pertinent Vitals/Pain Pain Assessment Pain Assessment: 0-10 Pain Score: 5  Pain Location: L knee Pain Descriptors / Indicators: Aching, Sore Pain Intervention(s): Limited activity within patient's tolerance, Monitored during session, Premedicated before session, Ice applied    Home Living                          Prior Function  PT Goals (current goals can now be found in the care plan section) Acute Rehab PT Goals Patient Stated Goal: Regain IND PT Goal Formulation: With patient Time For Goal Achievement: 04/15/24 Potential to Achieve Goals: Good Progress towards PT goals: Progressing toward goals    Frequency    7X/week      PT Plan      Co-evaluation              AM-PAC PT "6 Clicks" Mobility   Outcome Measure  Help needed turning from your back to your side while in a flat bed without using bedrails?: A Little Help needed moving from lying on your back to sitting on the side of a flat bed without using bedrails?: A Little Help needed moving to and from a bed to a chair (including a wheelchair)?: A Little Help needed  standing up from a chair using your arms (e.g., wheelchair or bedside chair)?: A Little Help needed to walk in hospital room?: A Little Help needed climbing 3-5 steps with a railing? : A Lot 6 Click Score: 17    End of Session Equipment Utilized During Treatment: Gait belt Activity Tolerance: Patient limited by fatigue Patient left: in bed;with call bell/phone within reach;with bed alarm set;with family/visitor present Nurse Communication: Mobility status PT Visit Diagnosis: Difficulty in walking, not elsewhere classified (R26.2)     Time: 0454-0981 PT Time Calculation (min) (ACUTE ONLY): 32 min  Charges:    $Gait Training: 8-22 mins $Therapeutic Exercise: 8-22 mins $Therapeutic Activity: 8-22 mins PT General Charges $$ ACUTE PT VISIT: 1 Visit                     Brooke Cole PT Acute Rehabilitation Services Pager [phone] Office [phone]    Brooke Cole 04/09/2024, 1:13 PM

## 2024-04-09 NOTE — Care Management Obs Status (Signed)
 MEDICARE OBSERVATION STATUS NOTIFICATION   Patient Details  Name: Brooke Cole MRN: [id number] Date of Birth: 1946-03-05   Medicare Observation Status Notification Given:  Yes    Delilah Fend, LCSW 04/09/2024, 10:56 AM

## 2024-04-10 DIAGNOSIS — Z96651 Presence of right artificial knee joint: Secondary | ICD-10-CM | POA: Diagnosis present

## 2024-04-10 DIAGNOSIS — K219 Gastro-esophageal reflux disease without esophagitis: Secondary | ICD-10-CM | POA: Diagnosis present

## 2024-04-10 DIAGNOSIS — Z888 Allergy status to other drugs, medicaments and biological substances status: Secondary | ICD-10-CM | POA: Diagnosis not present

## 2024-04-10 DIAGNOSIS — Z885 Allergy status to narcotic agent status: Secondary | ICD-10-CM | POA: Diagnosis not present

## 2024-04-10 DIAGNOSIS — M1990 Unspecified osteoarthritis, unspecified site: Secondary | ICD-10-CM | POA: Diagnosis present

## 2024-04-10 DIAGNOSIS — Z79899 Other long term (current) drug therapy: Secondary | ICD-10-CM | POA: Diagnosis not present

## 2024-04-10 DIAGNOSIS — E785 Hyperlipidemia, unspecified: Secondary | ICD-10-CM | POA: Diagnosis present

## 2024-04-10 DIAGNOSIS — R7303 Prediabetes: Secondary | ICD-10-CM | POA: Diagnosis present

## 2024-04-10 DIAGNOSIS — Z7951 Long term (current) use of inhaled steroids: Secondary | ICD-10-CM | POA: Diagnosis not present

## 2024-04-10 DIAGNOSIS — Z881 Allergy status to other antibiotic agents status: Secondary | ICD-10-CM | POA: Diagnosis not present

## 2024-04-10 DIAGNOSIS — I129 Hypertensive chronic kidney disease with stage 1 through stage 4 chronic kidney disease, or unspecified chronic kidney disease: Secondary | ICD-10-CM | POA: Diagnosis present

## 2024-04-10 DIAGNOSIS — M1712 Unilateral primary osteoarthritis, left knee: Secondary | ICD-10-CM | POA: Diagnosis present

## 2024-04-10 NOTE — Progress Notes (Signed)
 Brooke Cole is a 78 year old female who is POD 2 s/p left total knee arthroplasty by Dr. Lucienne Ryder.  Doing better than yesterday in regards to the "wooziness" and nausea that she had yesterday.  Compazine has been more helpful than Zofran for her.  No chest pain or shortness of breath.  No significant calf pain.  She has not ambulated yet today but did struggle with this yesterday.  Her daughter is here with her today and they are considering discharging to SNF as an option.  On exam, patient has dressing clean dry and intact with no gross blood or drainage.  Not able to perform straight leg raise.  Minimal Tenderness in the posterior lateral calf with negative Homans' sign.  Palpable DP pulse.  Intact ankle dorsiflexion and plantarflexion.  Plan is mobilize with physical therapy today.  Will discuss her plan for SNF with PT and social worker and we will see how her PT progress goes in the next couple days.  Do not anticipate discharge today.

## 2024-04-10 NOTE — Plan of Care (Signed)
   Problem: Activity: Goal: Risk for activity intolerance will decrease Outcome: Progressing   Problem: Coping: Goal: Level of anxiety will decrease Outcome: Progressing   Problem: Safety: Goal: Ability to remain free from injury will improve Outcome: Progressing

## 2024-04-10 NOTE — Progress Notes (Signed)
 Physical Therapy Treatment Patient Details Name: Brooke Cole MRN: [id number] DOB: 1946/05/06 Today's Date: 04/10/2024   History of Present Illness Pt s/p L TKR and with hx of R TKR and CKD    PT Comments  Pt continues very cooperative but progressing slowly with mobility and requiring increased time for all tasks - limited by pain, ongoing nausea and fatigue.  This am, pt performed therex program with assist and up to ambulate limited distance in hall.  Based on slow progress, Patient will benefit from continued inpatient follow up therapy, <3 hours/day   If plan is discharge home, recommend the following: A little help with walking and/or transfers;A little help with bathing/dressing/bathroom;Assistance with cooking/housework;Assist for transportation;Help with stairs or ramp for entrance   Can travel by private vehicle        Equipment Recommendations  None recommended by PT    Recommendations for Other Services       Precautions / Restrictions Precautions Precautions: Fall;Knee Recall of Precautions/Restrictions: Intact Restrictions Weight Bearing Restrictions Per Provider Order: No Other Position/Activity Restrictions: WBAT     Mobility  Bed Mobility Overal bed mobility: Needs Assistance Bed Mobility: Supine to Sit     Supine to sit: Min assist     General bed mobility comments: Increased time with cues for sequence and use of R LE to self assist    Transfers Overall transfer level: Needs assistance Equipment used: Rolling walker (2 wheels) Transfers: Sit to/from Stand Sit to Stand: Min assist           General transfer comment: cues for LE management and use of UEs to self assist    Ambulation/Gait Ambulation/Gait assistance: Min assist Gait Distance (Feet): 32 Feet Assistive device: Rolling walker (2 wheels) Gait Pattern/deviations: Step-to pattern, Decreased step length - right, Decreased step length - left, Shuffle, Trunk flexed Gait velocity:  decr     General Gait Details: INcreased time with cues for sequence, posture and position from [employer]             Wheelchair Mobility     Tilt Bed    Modified Rankin (Stroke Patients Only)       Balance Overall balance assessment: Mild deficits observed, not formally tested                                          Communication Communication Communication: No apparent difficulties  Cognition Arousal: Alert Behavior During Therapy: WFL for tasks assessed/performed   PT - Cognitive impairments: No apparent impairments                         Following commands: Intact      Cueing    Exercises Total Joint Exercises Ankle Circles/Pumps: AROM, Both, 10 reps, Supine Quad Sets: AROM, Both, 10 reps, Supine Heel Slides: AAROM, Left, Supine, 15 reps Straight Leg Raises: AAROM, AROM, Left, 10 reps, Supine Goniometric ROM: AAROM L knee -5 - 35    General Comments        Pertinent Vitals/Pain Pain Assessment Pain Assessment: 0-10 Pain Score: 7  Pain Location: L knee with activity; 4/10 a rest Pain Descriptors / Indicators: Aching, Sore Pain Intervention(s): Limited activity within patient's tolerance, Monitored during session, Premedicated before session, Ice applied    Home Living  Prior Function            PT Goals (current goals can now be found in the care plan section) Acute Rehab PT Goals Patient Stated Goal: Regain IND PT Goal Formulation: With patient Time For Goal Achievement: 04/15/24 Potential to Achieve Goals: Good Progress towards PT goals: Progressing toward goals    Frequency    7X/week      PT Plan      Co-evaluation              AM-PAC PT "6 Clicks" Mobility   Outcome Measure  Help needed turning from your back to your side while in a flat bed without using bedrails?: A Little Help needed moving from lying on your back to sitting on the side of a  flat bed without using bedrails?: A Little Help needed moving to and from a bed to a chair (including a wheelchair)?: A Little Help needed standing up from a chair using your arms (e.g., wheelchair or bedside chair)?: A Little Help needed to walk in hospital room?: A Little Help needed climbing 3-5 steps with a railing? : A Lot 6 Click Score: 17    End of Session Equipment Utilized During Treatment: Gait belt Activity Tolerance: Patient limited by fatigue;Other (comment);Patient limited by pain (nausea) Patient left: in chair;with call bell/phone within reach;with chair alarm set Nurse Communication: Mobility status PT Visit Diagnosis: Difficulty in walking, not elsewhere classified (R26.2)     Time: 0820-0850 PT Time Calculation (min) (ACUTE ONLY): 30 min  Charges:    $Gait Training: 8-22 mins $Therapeutic Exercise: 8-22 mins PT General Charges $$ ACUTE PT VISIT: 1 Visit                     Thedora Finlay PT Acute Rehabilitation Services Pager [phone] Office [phone]    Hao Dion 04/10/2024, 12:06 PM

## 2024-04-10 NOTE — Plan of Care (Signed)
  Problem: Coping: Goal: Level of anxiety will decrease Outcome: Progressing   Problem: Elimination: Goal: Will not experience complications related to urinary retention Outcome: Progressing   Problem: Safety: Goal: Ability to remain free from injury will improve Outcome: Progressing   Problem: Activity: Goal: Range of joint motion will improve Outcome: Progressing

## 2024-04-10 NOTE — Progress Notes (Signed)
 Physical Therapy Treatment Patient Details Name: Brooke Cole MRN: [id number] DOB: [birth year] Today's Date: 04/10/2024   History of Present Illness Pt s/p L TKR and with hx of R TKR and CKD    PT Comments  Pt continues very cooperative and with reported improvement in nausea and pain control.  Pt requiring increased time but with noted decreasing level of assist for most tasks and increased activity tolerance.  Pt reveals family is very limited in ability to provide 24/7 assist at dc.  Patient will benefit from continued inpatient follow up therapy, <3 hours/day     If plan is discharge home, recommend the following: A little help with walking and/or transfers;A little help with bathing/dressing/bathroom;Assistance with cooking/housework;Assist for transportation;Help with stairs or ramp for entrance   Can travel by private vehicle        Equipment Recommendations  None recommended by PT    Recommendations for Other Services       Precautions / Restrictions Precautions Precautions: Fall;Knee Recall of Precautions/Restrictions: Intact Restrictions Weight Bearing Restrictions Per Provider Order: No Other Position/Activity Restrictions: WBAT     Mobility  Bed Mobility Overal bed mobility: Needs Assistance Bed Mobility: Supine to Sit, Sit to Supine     Supine to sit: Min assist Sit to supine: Min assist   General bed mobility comments: Increased time with cues for sequence and use of R LE to self assist    Transfers Overall transfer level: Needs assistance Equipment used: Rolling walker (2 wheels) Transfers: Sit to/from Stand Sit to Stand: Contact guard assist           General transfer comment: cues for LE management and use of UEs to self assist    Ambulation/Gait Ambulation/Gait assistance: Min assist, Contact guard assist Gait Distance (Feet): 58 Feet Assistive device: Rolling walker (2 wheels) Gait Pattern/deviations: Step-to pattern, Decreased step  length - right, Decreased step length - left, Shuffle, Trunk flexed Gait velocity: decr     General Gait Details: INcreased time with cues for sequence, posture and position from [employer]             Wheelchair Mobility     Tilt Bed    Modified Rankin (Stroke Patients Only)       Balance Overall balance assessment: Mild deficits observed, not formally tested                                          Communication Communication Communication: No apparent difficulties  Cognition Arousal: Alert Behavior During Therapy: WFL for tasks assessed/performed   PT - Cognitive impairments: No apparent impairments                         Following commands: Intact      Cueing    Exercises Total Joint Exercises Ankle Circles/Pumps: AROM, Both, 10 reps, Supine Quad Sets: AROM, Both, 10 reps, Supine Heel Slides: AAROM, Left, Supine, 15 reps Straight Leg Raises: AAROM, AROM, Left, 10 reps, Supine Goniometric ROM: AAROM L knee -5 - 35    General Comments        Pertinent Vitals/Pain Pain Assessment Pain Assessment: 0-10 Pain Score: 5  Pain Location: L knee with activity Pain Descriptors / Indicators: Aching, Sore Pain Intervention(s): Limited activity within patient's tolerance, Monitored during session, Premedicated before session, Ice applied    Home Living  Prior Function            PT Goals (current goals can now be found in the care plan section) Acute Rehab PT Goals Patient Stated Goal: Regain IND PT Goal Formulation: With patient Time For Goal Achievement: 04/15/24 Potential to Achieve Goals: Good Progress towards PT goals: Progressing toward goals    Frequency    7X/week      PT Plan      Co-evaluation              AM-PAC PT "6 Clicks" Mobility   Outcome Measure  Help needed turning from your back to your side while in a flat bed without using bedrails?: A  Little Help needed moving from lying on your back to sitting on the side of a flat bed without using bedrails?: A Little Help needed moving to and from a bed to a chair (including a wheelchair)?: A Little Help needed standing up from a chair using your arms (e.g., wheelchair or bedside chair)?: A Little Help needed to walk in hospital room?: A Little Help needed climbing 3-5 steps with a railing? : A Lot 6 Click Score: 17    End of Session Equipment Utilized During Treatment: Gait belt Activity Tolerance: Patient tolerated treatment well;Patient limited by fatigue Patient left: in bed;with call bell/phone within reach;with family/visitor present Nurse Communication: Mobility status PT Visit Diagnosis: Difficulty in walking, not elsewhere classified (R26.2)     Time: 1610-9604 PT Time Calculation (min) (ACUTE ONLY): 35 min  Charges:    $Gait Training: 23-37 mins PT General Charges $$ ACUTE PT VISIT: 1 Visit                     Thedora Finlay PT Acute Rehabilitation Services Pager [phone] Office [phone]    Brooke Cole 04/10/2024, 4:19 PM

## 2024-04-11 ENCOUNTER — Encounter (HOSPITAL_COMMUNITY): Payer: Self-pay | Admitting: Orthopaedic Surgery

## 2024-04-11 MED ORDER — TRAMADOL HCL 50 MG PO TABS
50.0000 mg | ORAL_TABLET | Freq: Four times a day (QID) | ORAL | 0 refills | Status: DC | PRN
Start: 2024-04-11 — End: 2024-05-03

## 2024-04-11 MED ORDER — ASPIRIN 81 MG PO CHEW: 81.0000 mg | CHEWABLE_TABLET | Freq: Two times a day (BID) | ORAL | 0 refills | Status: AC

## 2024-04-11 MED ORDER — METHOCARBAMOL 500 MG PO TABS: 500.0000 mg | ORAL_TABLET | Freq: Four times a day (QID) | ORAL | 1 refills | Status: AC | PRN

## 2024-04-11 NOTE — [place] FL2 (Signed)
 [place] MEDICAID FL2 LEVEL OF CARE FORM     IDENTIFICATION  Patient Name: Brooke Cole Birthdate: Sep 14, 1946 Sex: female Admission Date (Current Location): 04/08/2024  [facility] and [place] Number:  Producer, television/film/video and Address:  [facility],  [address] [place], [place] [address]      Provider Number: [id number]  Attending Physician Name and Address:  Kathryne Hitch,*  Relative Name and Phone Number:  [place], [place] [phone])  [phone] [facility])    Current Level of Care: Hospital Recommended Level of Care: Skilled Nursing Facility Prior Approval Number:    Date Approved/Denied:   PASRR Number: [id number]  Discharge Plan: SNF    Current Diagnoses: 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   Unilateral primary osteoarthritis, left knee 04/07/2024   Essential hypertension 03/30/2023   Hyperlipidemia 03/30/2023   Prediabetes 07/16/2021   Status post total right knee replacement 07/11/2016   Esophageal reflux 12/28/2015   Moderate persistent asthma 12/28/2015    Orientation RESPIRATION BLADDER Height & Weight     Self, Time, Situation, Place  Normal (regular) Continent Weight: 68.9 kg Height:  5\' 2"  (157.5 cm)  BEHAVIORAL SYMPTOMS/MOOD NEUROLOGICAL BOWEL NUTRITION STATUS      Continent Diet  AMBULATORY STATUS COMMUNICATION OF NEEDS Skin   Limited Assist Verbally Surgical wounds (s/p Left TKA)                       Personal Care Assistance Level of Assistance  Bathing, Feeding, Dressing Bathing Assistance: Limited assistance Feeding assistance: Limited assistance Dressing Assistance: Limited assistance     Functional Limitations Info  Sight, Hearing, Speech Sight Info: Impaired (eyeglasses) Hearing Info: Adequate Speech Info: Adequate    SPECIAL CARE FACTORS FREQUENCY  PT (By licensed PT), OT (By licensed OT)     PT Frequency: 5x/wk OT Frequency: 5x/wk             Contractures Contractures Info: Not present    Additional Factors Info  Code Status, Allergies, Psychotropic Code Status Info: Full C ode Allergies Info: Actonel (Risedronate Sodium), Oxycodone, Maxitrol (Neomycin-polymyxin-dexameth), Levofloxacin, Niacin And Related, Erythromycin Base Psychotropic Info: N/A         Current Medications (04/11/2024):  This is the current hospital active medication list Current Facility-Administered Medications  Medication Dose Route Frequency Provider Last Rate Last Admin   acetaminophen (TYLENOL) tablet 325-650 mg  325-650 mg Oral Q6H PRN Kathryne Hitch, MD   650 mg at 04/09/24 0643   albuterol (VENTOLIN HFA) 108 (90 Base) MCG/ACT inhaler 2 puff  2 puff Inhalation Q4H PRN Len Childs T, RPH   2 puff at 04/08/24 1232   alum & mag hydroxide-simeth (MAALOX/MYLANTA) 200-200-20 MG/5ML suspension 30 mL  30 mL Oral Q4H PRN Kathryne Hitch, MD       amLODipine (NORVASC) tablet 10 mg  10 mg Oral QPM Kathryne Hitch, MD   10 mg at 04/10/24 1842   aspirin chewable tablet 81 mg  81 mg Oral BID Kathryne Hitch, MD   81 mg at 04/11/24 0827   diphenhydrAMINE (BENADRYL) 12.5 MG/5ML elixir 12.5-25 mg  12.5-25 mg Oral Q4H PRN Kathryne Hitch, MD       docusate sodium (COLACE) capsule 100 mg  100 mg Oral BID Kathryne Hitch, MD   100 mg at 04/11/24 0827   doxycycline (PERIOSTAT) tablet 20 mg  20 mg Oral QHS Kathryne Hitch,  MD       fluticasone furoate-vilanterol (BREO ELLIPTA) 200-25 MCG/ACT 1 puff  1 puff Inhalation Daily Arnie Lao, MD   1 puff at 04/11/24 0906   lisinopril (ZESTRIL) tablet 40 mg  40 mg Oral q AM Arnie Lao, MD   40 mg at 04/11/24 0827   menthol-cetylpyridinium (CEPACOL) lozenge 3 mg  1 lozenge Oral PRN Arnie Lao, MD       Or   phenol (CHLORASEPTIC) mouth spray 1 spray  1 spray Mouth/Throat PRN Arnie Lao, MD       methocarbamol  (ROBAXIN) tablet 500 mg  500 mg Oral Q6H PRN Arnie Lao, MD   500 mg at 04/10/24 2143   Or   methocarbamol (ROBAXIN) injection 500 mg  500 mg Intravenous Q6H PRN Arnie Lao, MD       metoCLOPramide (REGLAN) tablet 5-10 mg  5-10 mg Oral Q8H PRN Arnie Lao, MD   10 mg at 04/10/24 1542   Or   metoCLOPramide (REGLAN) injection 5-10 mg  5-10 mg Intravenous Q8H PRN Arnie Lao, MD   10 mg at 04/10/24 0511   metoprolol tartrate (LOPRESSOR) tablet 25 mg  25 mg Oral BID Blackman, Christopher Y, MD   25 mg at 04/11/24 0827   montelukast (SINGULAIR) tablet 10 mg  10 mg Oral QHS Arnie Lao, MD   10 mg at 04/10/24 2145   morphine (PF) 2 MG/ML injection 0.5-1 mg  0.5-1 mg Intravenous Q2H PRN Arnie Lao, MD       pantoprazole (PROTONIX) EC tablet 40 mg  40 mg Oral Daily Arnie Lao, MD   40 mg at 04/11/24 0827   prochlorperazine (COMPAZINE) injection 5 mg  5 mg Intravenous Q4H PRN Magnant, Charles L, PA-C   5 mg at 04/11/24 8295   sertraline (ZOLOFT) tablet 50 mg  50 mg Oral QHS Blackman, Christopher Y, MD   50 mg at 04/10/24 2145   traMADol (ULTRAM) tablet 100 mg  100 mg Oral Q6H PRN Arnie Lao, MD   100 mg at 04/11/24 0827     Discharge Medications: Please see discharge summary for a list of discharge medications.  Relevant Imaging Results:  Relevant Lab Results:   Additional Information SSN: [id number]  Bari Leys, RN

## 2024-04-11 NOTE — Progress Notes (Signed)
 Physical Therapy Treatment Patient Details Name: Brooke Cole MRN: [id number] DOB: 1946-11-13 Today's Date: 04/11/2024   History of Present Illness Pt s/p L TKR and with hx of R TKR and CKD    PT Comments  Pt continues to progress toward goals. Plan is for SNF post acute    If plan is discharge home, recommend the following: Assistance with cooking/housework   Can travel by private vehicle     Yes  Equipment Recommendations  None recommended by PT    Recommendations for Other Services       Precautions / Restrictions Precautions Precautions: Fall;Knee Precaution Booklet Issued: No Recall of Precautions/Restrictions: Intact Restrictions Weight Bearing Restrictions Per Provider Order: No Other Position/Activity Restrictions: WBAT     Mobility  Bed Mobility Overal bed mobility: Needs Assistance Bed Mobility: Sit to Supine       Sit to supine: Supervision   General bed mobility comments: pt able to use gait belt to self assist  LLE    Transfers Overall transfer level: Needs assistance Equipment used: Rolling walker (2 wheels) Transfers: Sit to/from Stand Sit to Stand: Supervision           General transfer comment: cues for LE management and use of UEs to self assist    Ambulation/Gait Ambulation/Gait assistance: Supervision, Contact guard assist Gait Distance (Feet): 60 Feet Assistive device: Rolling walker (2 wheels) Gait Pattern/deviations: Step-to pattern, Decreased weight shift to left       General Gait Details: ongoing education for hand placement and LLE position   Stairs             Wheelchair Mobility     Tilt Bed    Modified Rankin (Stroke Patients Only)       Balance                                            Communication Communication Communication: No apparent difficulties  Cognition Arousal: Alert Behavior During Therapy: WFL for tasks assessed/performed   PT - Cognitive impairments: No  apparent impairments                         Following commands: Intact      Cueing Cueing Techniques: Verbal cues  Exercises Total Joint Exercises Ankle Circles/Pumps: AROM, Both, 10 reps Quad Sets: AROM, Both, 10 reps, Supine Heel Slides: AAROM, Left, Supine, 10 reps Straight Leg Raises: AAROM, AROM, Left, 10 reps, Supine    General Comments        Pertinent Vitals/Pain Pain Assessment Pain Assessment: 0-10 Pain Score: 5  Pain Location: L knee with activity Pain Descriptors / Indicators: Aching, Sore Pain Intervention(s): Limited activity within patient's tolerance, Monitored during session, Premedicated before session, Ice applied    Home Living                          Prior Function            PT Goals (current goals can now be found in the care plan section) Acute Rehab PT Goals PT Goal Formulation: With patient Time For Goal Achievement: 04/15/24 Potential to Achieve Goals: Good Progress towards PT goals: Progressing toward goals    Frequency    7X/week      PT Plan      Co-evaluation  AM-PAC PT "6 Clicks" Mobility   Outcome Measure  Help needed turning from your back to your side while in a flat bed without using bedrails?: A Little Help needed moving from lying on your back to sitting on the side of a flat bed without using bedrails?: A Little Help needed moving to and from a bed to a chair (including a wheelchair)?: A Little Help needed standing up from a chair using your arms (e.g., wheelchair or bedside chair)?: A Little Help needed to walk in hospital room?: A Little Help needed climbing 3-5 steps with a railing? : A Little 6 Click Score: 18    End of Session Equipment Utilized During Treatment: Gait belt Activity Tolerance: Patient tolerated treatment well Patient left: with call bell/phone within reach;in bed;with bed alarm set;with family/visitor present Nurse Communication: Mobility status PT  Visit Diagnosis: Difficulty in walking, not elsewhere classified (R26.2)     Time: 5784-6962 PT Time Calculation (min) (ACUTE ONLY): 21 min  Charges:    $Gait Training: 8-22 mins PT General Charges $$ ACUTE PT VISIT: 1 Visit                     Monnica Saltsman, PT  Acute Rehab Dept ([facility]) [phone]  04/11/2024    [facility] 04/11/2024, 11:13 AM

## 2024-04-11 NOTE — TOC Progression Note (Addendum)
 Transition of Care [facility]) - Progression Note    Patient Details  Name: Brooke Cole MRN: [id number] Date of Birth: Sep 26, 1946  [facility]) CM/SW Contact  Bari Leys, RN Phone Number: 04/11/2024, 10:26 AM  Clinical Narrative:   [facility] consult for SNF placement, per MD 4/14 progress notes, patient struggling with mobility , no significant assistance at home. NCM met with pt at bedside to introduce role of TOC/NCM and review for dc planning, pt agreeable to short term rehab/SNF, prefers [facility] or [facility]. PT reports she resides live and that she works full time, reports no current home care services or home DME, reports she has a daughter and son who live in the are and are supportive.   10:46am FL2 updated, faxed out for bed offers.   -2:08pm Met with patient at bedside to review SNF bed offers( [facility], [facility], [facility], [facility], [facility], [facility], [facility], [facility], [facility], [facility]), pt agreeable [facility], [facility], admit coord, notified, insurance auth initiated.   -2:58pm SNF insurance [facility] initiated, [place] [id number], auth pending.      Barriers to Discharge: No Barriers Identified  Expected Discharge Plan and Services                         DME Arranged: N/A DME Agency: NA       HH Arranged: PT HH Agency: [facility]         Social Determinants of Health (SDOH) Interventions SDOH Screenings   Food Insecurity: No Food Insecurity (04/08/2024)  Housing: Low Risk  (04/08/2024)  Transportation Needs: No Transportation Needs (04/08/2024)  Utilities: Not At Risk (04/08/2024)  Social Connections: Moderately Integrated (04/08/2024)  Tobacco Use: Low Risk  (04/08/2024)    Readmission Risk Interventions     No data to display

## 2024-04-11 NOTE — Progress Notes (Signed)
 Patient ID: Brooke Cole, female   DOB: Aug 03, 1946, 78 y.o.   MRN: [id number] The patient continues to struggle with her mobility.  Apparently she does not have any significant assistance at home.  She is requesting short-term skilled nursing at this standpoint.  The physical therapy notes also show that she has had some limited mobility.  Her pain control and nausea are better fortunately.  Her vital signs are stable.  Her left operative knee is stable.  We will consult the transitional care team for looking into short-term skilled nursing.

## 2024-04-12 MED ORDER — FLUTICASONE PROPIONATE 50 MCG/ACT NA SUSP
1.0000 | Freq: Every day | NASAL | Status: DC
  Administered 2024-04-12: 1 via NASAL
  Filled 2024-04-12: qty 16

## 2024-04-12 NOTE — Progress Notes (Signed)
 Physical Therapy Treatment Patient Details Name: Brooke Cole MRN: [id number] DOB: 1946-12-17 Today's Date: 04/12/2024   History of Present Illness Pt s/p L TKR and with hx of R TKR and CKD    PT Comments  POD # 1 am session PT - Cognition Comments: AxO x 3 pleasant Lady who lives home alone and plans to D/C to SNF Assisted OOB to amb in hallway.  General bed mobility comments: increased time using belt to self assist LE.  General transfer comment: cues for LE management and use of UEs to self assist as well as increased time    General Gait Details: VC's on proper walker to self distance as well as safety with turns.  Decreased amb distance this session due to increased c/o fatigue.  Then returned to room to perform some TE's following HEP handout.  Instructed on proper tech, freq as well as use of ICE.   Pt lives home alone and plans to DC to a SNF for Rehab.     If plan is discharge home, recommend the following: Assistance with cooking/housework   Can travel by private vehicle     Yes  Equipment Recommendations  None recommended by PT    Recommendations for Other Services       Precautions / Restrictions Precautions Precautions: Fall;Knee Precaution/Restrictions Comments: no pillow under knee Restrictions Weight Bearing Restrictions Per Provider Order: No Other Position/Activity Restrictions: WBAT     Mobility  Bed Mobility Overal bed mobility: Needs Assistance Bed Mobility: Supine to Sit     Supine to sit: Supervision, Contact guard     General bed mobility comments: increased time using belt to self assist LE    Transfers Overall transfer level: Needs assistance Equipment used: Standard walker Transfers: Sit to/from Stand Sit to Stand: Supervision           General transfer comment: cues for LE management and use of UEs to self assist as well as increased time    Ambulation/Gait Ambulation/Gait assistance: Supervision, Contact guard assist Gait  Distance (Feet): 42 Feet Assistive device: Rolling walker (2 wheels) Gait Pattern/deviations: Step-to pattern, Decreased weight shift to left Gait velocity: decreased     General Gait Details: VC's on proper walker to self distance as well as safety with turns.  Decreased amb distance this session due to increased c/o fatigue.   Stairs             Wheelchair Mobility     Tilt Bed    Modified Rankin (Stroke Patients Only)       Balance                                            Communication Communication Communication: No apparent difficulties  Cognition Arousal: Alert Behavior During Therapy: WFL for tasks assessed/performed   PT - Cognitive impairments: No apparent impairments                       PT - Cognition Comments: AxO x 3 pleasant Lady who lives home alone and plans to D/C to [facility] Following commands: Intact      Cueing Cueing Techniques: Verbal cues  Exercises  Total Knee Replacement TE's following HEP handout 10 reps B LE ankle pumps 05 reps towel squeezes 05 reps knee presses 05 reps heel slides  05 reps SAQ's 05 reps SLR's 05  reps ABD Educated on use of gait belt to assist with TE's Followed by ICE     General Comments        Pertinent Vitals/Pain Pain Assessment Pain Assessment: 0-10 Pain Score: 3  Pain Location: L knee with activity Pain Descriptors / Indicators: Aching, Sore, Operative site guarding Pain Intervention(s): Monitored during session, Premedicated before session, Repositioned, Ice applied    Home Living                          Prior Function            PT Goals (current goals can now be found in the care plan section) Progress towards PT goals: Progressing toward goals    Frequency    7X/week      PT Plan      Co-evaluation              AM-PAC PT "6 Clicks" Mobility   Outcome Measure  Help needed turning from your back to your side while in a flat bed  without using bedrails?: A Little Help needed moving from lying on your back to sitting on the side of a flat bed without using bedrails?: A Little Help needed moving to and from a bed to a chair (including a wheelchair)?: A Little Help needed standing up from a chair using your arms (e.g., wheelchair or bedside chair)?: A Little Help needed to walk in hospital room?: A Little Help needed climbing 3-5 steps with a railing? : A Lot 6 Click Score: 17    End of Session Equipment Utilized During Treatment: Gait belt Activity Tolerance: Patient tolerated treatment well Patient left: with call bell/phone within reach;with family/visitor present;in chair Nurse Communication: Mobility status PT Visit Diagnosis: Difficulty in walking, not elsewhere classified (R26.2)     Time: 2130-8657 PT Time Calculation (min) (ACUTE ONLY): 26 min  Charges:    $Gait Training: 8-22 mins $Therapeutic Exercise: 8-22 mins PT General Charges $$ ACUTE PT VISIT: 1 Visit                    Bess Broody  PTA Acute  Rehabilitation Services Office M-F          [phone]

## 2024-04-12 NOTE — TOC Progression Note (Signed)
 Transition of Care [facility]) - Progression Note    Patient Details  Name: CATALEIA GADE MRN: [id number] Date of Birth: 1946/06/01  Transition of Care [facility]) CM/SW Contact  Bari Leys, RN Phone Number: 04/12/2024, 1:49 PM  Clinical Narrative:   Per [facility] health SNF auth approved  Plan Auth ID [id number]  Auth ID [id number], days approved 04/12/2024-04/14/2024, team notified.       Barriers to Discharge: No Barriers Identified  Expected Discharge Plan and Services         Expected Discharge Date: 04/12/24               DME Arranged: N/A DME Agency: NA       HH Arranged: PT HH Agency: [facility]         Social Determinants of Health (SDOH) Interventions SDOH Screenings   Food Insecurity: No Food Insecurity (04/08/2024)  Housing: Low Risk  (04/08/2024)  Transportation Needs: No Transportation Needs (04/08/2024)  Utilities: Not At Risk (04/08/2024)  Social Connections: Moderately Integrated (04/08/2024)  Tobacco Use: Low Risk  (04/08/2024)    Readmission Risk Interventions     No data to display

## 2024-04-12 NOTE — TOC Transition Note (Signed)
 Transition of Care [facility]) - Discharge Note   Patient Details  Name: Brooke Cole MRN: [id number] Date of Birth: 06/11/1946  Transition of Care [facility]) CM/SW Contact:  Bari Leys, RN Phone Number: 04/12/2024, 2:16 PM   Clinical Narrative:  DC to [facility], RM 211, Nurse Report  [phone], pt and dtr, Tabitha, notified. PTAR for transport. No further TOC needs identified.      Final next level of care: Skilled Nursing Facility Barriers to Discharge: Barriers Resolved   Patient Goals and CMS Choice Patient states their goals for this hospitalization and ongoing recovery are:: return home CMS Medicare.gov Compare Post Acute Care list provided to:: Patient Choice offered to / list presented to : Patient [place] ownership interest in [facility].provided to:: Patient    Discharge Placement              Patient chooses bed at: [facility] [place] Patient to be transferred to facility by: [facility] Name of family member notified: Tabitha (dtr) Patient and family notified of of transfer: 04/12/24  Discharge Plan and Services Additional resources added to the After Visit Summary for                  DME Arranged: N/A DME Agency: NA       HH Arranged: PT HH Agency: [facility]        Social Drivers of Health (SDOH) Interventions SDOH Screenings   Food Insecurity: No Food Insecurity (04/08/2024)  Housing: Low Risk  (04/08/2024)  Transportation Needs: No Transportation Needs (04/08/2024)  Utilities: Not At Risk (04/08/2024)  Social Connections: Moderately Integrated (04/08/2024)  Tobacco Use: Low Risk  (04/08/2024)     Readmission Risk Interventions    04/12/2024    2:14 PM  Readmission Risk Prevention Plan  Post Dischage Appt Complete  Medication Screening Complete  Transportation Screening Complete

## 2024-04-12 NOTE — Progress Notes (Signed)
 Patient ID: Brooke Cole, female   DOB: Feb 18, 1946, 78 y.o.   MRN: [id number] The patient is awake and alert this morning.  Her vital signs are stable.  Her left operative knee is stable.  If a short-term skilled nursing facility bed is available, she can be discharged today.  Prescriptions are on the chart and I will go ahead and put in a discharge summary.

## 2024-04-12 NOTE — Discharge Summary (Signed)
 Patient ID: Brooke Cole MRN: [id number] DOB/AGE: Nov 04, 1946 78 y.o.  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 female who was admitted 04/08/2024 for operative treatment ofUnilateral primary osteoarthritis, left knee. Patient has severe unremitting pain that affects sleep, daily activities, and work/hobbies. After pre-op clearance the patient was taken to the operating room on 04/08/2024 and underwent  Procedure(s): ARTHROPLASTY, KNEE, TOTAL.    Patient was given perioperative antibiotics:  Anti-infectives (From admission, onward)    Start     Dose/Rate Route Frequency Ordered Stop   04/08/24 2200  doxycycline (PERIOSTAT) tablet 20 mg        20 mg Oral Daily at bedtime 04/08/24 1038     04/08/24 1400  ceFAZolin (ANCEF) IVPB 2g/100 mL premix        2 g 200 mL/hr over 30 Minutes Intravenous Every 6 hours 04/08/24 1038 04/08/24 2030   04/08/24 0600  ceFAZolin  (ANCEF) IVPB 2g/100 mL premix        2 g 200 mL/hr over 30 Minutes Intravenous On call to O.R. 04/08/24 0865 04/08/24 0735        Patient was given sequential compression devices, early ambulation, and chemoprophylaxis to prevent DVT.  Patient benefited maximally from hospital stay and there were no complications.    Recent vital signs: Patient Vitals for the past 24 hrs:  BP Temp Temp src Pulse Resp SpO2  04/12/24 0623 (!) 121/51 98.3 F (36.8 C) Oral 87 17 94 %  04/11/24 2200 137/64 -- -- 60 -- --  04/11/24 2154 137/64 98.9 F (37.2 C) -- 60 16 92 %  04/11/24 1327 (!) 106/47 98.7 F (37.1 C) Oral (!) 54 18 94 %  04/11/24 0907 -- -- -- -- -- 90 %  04/11/24 0827 (!) 114/55 -- -- 65 -- --     Recent laboratory studies: No results for input(s): "WBC", "HGB", "HCT", "PLT", "NA", "K", "CL", "CO2", "BUN", "CREATININE", "GLUCOSE", "INR", "CALCIUM" in the last 72 hours.  Invalid input(s): "PT", "2"   Discharge Medications:   Allergies as of 04/12/2024       Reactions   Actonel [risedronate Sodium] Cough   Chest pain; difficulty with swallowing    Oxycodone Nausea And Vomiting   Other Reaction(s): Other (See Comments) Feels bad/nausea   Maxitrol [neomycin-polymyxin-dexameth] Swelling   Eye swelling;  burning of eyes   Levofloxacin    Ligament tears while on medication   Niacin And Related    Erythromycin Base Rash   Ocular ointment caused redness and pain        Medication List     TAKE these medications    acetaminophen 500 MG tablet Commonly known as: TYLENOL Take 500-1,000 mg by mouth every 6 (six) hours as needed (pain.).   amLODipine 10 MG tablet Commonly known as: NORVASC Take 10 mg by mouth every evening.   aspirin 81 MG chewable tablet Chew 1 tablet (81 mg total) by mouth 2 (two) times daily.   cetirizine 10 MG tablet Commonly known as: ZYRTEC Take 10 mg by mouth in the morning.   doxycycline 20 MG tablet Commonly known as: PERIOSTAT Take 1 tablet  (20 mg total) by mouth daily. What changed: when to take this   Dulera 200-5 MCG/ACT Aero Generic drug: mometasone-formoterol Inhale 2 puffs into the lungs 2 (two) times daily.   EpiPen 2-Pak 0.3 mg/0.3 mL Soaj injection Generic drug: EPINEPHrine Inject 0.3 mg into the skin as needed for anaphylaxis.   EYE MULTIVITAMIN/LUTEIN PO Take 1 tablet by mouth in the morning.   fluticasone 50 MCG/ACT nasal spray Commonly known as: FLONASE Place 2 sprays into the nose daily as needed for allergies.   lisinopril 40 MG tablet Commonly known as: ZESTRIL Take 40 mg by mouth in the morning.   methocarbamol 500 MG tablet Commonly known as: ROBAXIN Take 1 tablet (500 mg total) by mouth every 6 (six) hours as needed for muscle spasms.   metoprolol tartrate 50 MG tablet Commonly known as: LOPRESSOR Take 25 mg by mouth 2 (two) times daily.   montelukast 10 MG tablet Commonly known as: SINGULAIR Take 10 mg by mouth at bedtime.   Omega-3 Krill Oil 1000 MG Caps Take 1 capsule by mouth in the morning.   omeprazole 20 MG capsule Commonly known as: PRILOSEC Take 20 mg by mouth every evening.   ondansetron 4 MG disintegrating tablet Commonly known as: ZOFRAN-ODT Take 4 mg by mouth every 8 (eight) hours as needed for nausea or vomiting. What changed: Another medication with the same name was added. Make sure you understand how and when to take each.   ondansetron 4 MG disintegrating tablet Commonly known as: ZOFRAN-ODT Take 1 tablet (4 mg total) by mouth every 8 (eight) hours as needed. What changed: You were already taking a medication with the same name, and this prescription was added. Make sure you understand how and when to take each.   PRESCRIPTION MEDICATION Place 3 drops under the tongue daily. Allergy drops   ProAir HFA 108 (90 Base) MCG/ACT inhaler Generic drug: albuterol Inhale 2 puffs into the lungs every 4 (four) hours as needed for wheezing or shortness of breath.    RETAINE [facility] OP Place 1-2 drops into both eyes 3 (three) times daily as needed (dry/irritated eyes.).   sertraline 50 MG tablet Commonly known as: ZOLOFT Take 50 mg by mouth at bedtime.   sodium chloride 5 % ophthalmic ointment Commonly known as: MURO 128 Place 1 Application into both eyes at bedtime. 0.25 inch   tacrolimus 0.1 % ointment Commonly known as: PROTOPIC Apply 1 Application topically in the morning.   traMADol 50 MG tablet Commonly known as: ULTRAM Take 1-2 tablets (50-100 mg total) by mouth every 6 (six) hours as needed for moderate pain (pain score 4-6).   triamcinolone cream 0.1 % Commonly known as:  KENALOG Apply 1 Application topically 2 (two) times daily as needed (eczema).               Durable Medical Equipment  (From admission, onward)           Start     Ordered   04/08/24 1039  DME 3 n 1  Once        04/08/24 1038   04/08/24 1039  DME Walker rolling  Once       Question Answer Comment  Walker: With 5 Inch Wheels   Patient needs a walker to treat with the following condition Status post total left knee replacement      04/08/24 1038            Diagnostic Studies: DG Knee Left Port Result Date: 04/08/2024 CLINICAL DATA:  Status post left knee replacement. EXAM: PORTABLE LEFT KNEE - 1-2 VIEW COMPARISON:  None Available. FINDINGS: Left knee arthroplasty in expected alignment. No periprosthetic lucency or fracture. There has been patellar resurfacing. Recent postsurgical change includes air and edema in the soft tissues and joint space. Anterior skin staples in place. IMPRESSION: Left knee arthroplasty without immediate postoperative complication. Electronically Signed   By: Chadwick Colonel M.D.   On: 04/08/2024 16:45    Disposition: Discharge disposition: 03-Skilled Nursing Facility          Contact information for follow-up providers     Arnie Lao, MD Follow up in 2 week(s).   Specialty: Orthopedic  Surgery Contact information: [address] [place]  [place] [address] [phone]              Contact information for after-discharge care     Destination     [facility] [place] [facility]erred SN[facility] .   Service: Skilled Nursing Contact information: [address] [place]  [phone]                      Signed: Arnie Lao 04/12/2024, 6:53 AM

## 2024-04-14 ENCOUNTER — Telehealth: Payer: Self-pay | Admitting: Orthopaedic Surgery

## 2024-04-14 NOTE — Telephone Encounter (Signed)
 Brooke Cole from [facility] have a patient that is being discharge today and she didn't have ant orders for outpatient or anything. CB#[phone]

## 2024-04-14 NOTE — Telephone Encounter (Signed)
 Patient leaving facility today and wanted me to make [facility] aware so they can get to her asap

## 2024-04-14 NOTE — Telephone Encounter (Signed)
 Pt called requesting a call from [place] [facility]. Pt states she need help with staring inpatient therapy again. Pt states sh had surgery and was going to outpatient but her asthma has been killing her and she can not do outpatient physical therapy. Pt states she can't remember th name of company that was coming to her home. She thinks it;'s [facility]. Please call pt about this matter as soon as you can at [phone].

## 2024-04-21 ENCOUNTER — Telehealth: Payer: Self-pay | Admitting: Radiology

## 2024-04-21 ENCOUNTER — Ambulatory Visit (INDEPENDENT_AMBULATORY_CARE_PROVIDER_SITE_OTHER): Payer: Medicare Other | Admitting: Orthopaedic Surgery

## 2024-04-21 DIAGNOSIS — Z96652 Presence of left artificial knee joint: Secondary | ICD-10-CM

## 2024-04-21 MED ORDER — MAGIC MOUTHWASH: 5.0000 mL | Freq: Two times a day (BID) | ORAL | 0 refills | Status: AC | PRN

## 2024-04-21 MED ORDER — LIDOCAINE VISCOUS HCL 2 % MT SOLN: 5.0000 mL | Freq: Three times a day (TID) | OROMUCOSAL | 0 refills | Status: AC

## 2024-04-21 MED ORDER — PROMETHAZINE HCL 12.5 MG PO TABS: 12.5000 mg | ORAL_TABLET | Freq: Three times a day (TID) | ORAL | 1 refills | Status: AC | PRN

## 2024-04-21 MED ORDER — MAGIC MOUTHWASH
5.0000 mL | Freq: Three times a day (TID) | ORAL | 1 refills | Status: DC | PRN
Start: 2024-04-21 — End: 2024-04-21

## 2024-04-21 NOTE — Progress Notes (Signed)
 The patient is a 78 year old female is here for first postoperative visit status post a left total knee replacement.  She would like to have home therapy continued at home since she is making good progress and given the fact she has significant asthma and the therapist that she has been to in the past as an outpatient causes her to have more breathing issues in terms of the building.  She said she has been able to flex her knee past 90 degrees.  She is still dealing of nausea and feeling weak overall.  She said years ago they sent her to the hospital for an IV.  That was in [facility] though.  Examination of her left knee today shows full extension to flexion past 90 degrees.  I remove the staples and place Steri-Strips.  She has been compliant with a baby aspirin  twice a day and her calf is soft.  Since she is making good progress with home health therapy I do not mind home therapy be extended formal weeks.  I will see her back in 4 weeks.  We will send in some Phenergan  for her and try to send in some Magic mouthwash because she is talk about she has some sores in her mouth.

## 2024-04-21 NOTE — Telephone Encounter (Signed)
 Email sent to [facility] with [facility] to conti. HHPT  for 4 weeks.

## 2024-05-02 ENCOUNTER — Telehealth: Payer: Self-pay | Admitting: Orthopaedic Surgery

## 2024-05-02 NOTE — Telephone Encounter (Signed)
 Jamilla from [facility] was sent some tramadol  for the patient and they only do Assisting living facilites. CB#[phone]

## 2024-05-03 ENCOUNTER — Other Ambulatory Visit: Payer: Self-pay | Admitting: Orthopaedic Surgery

## 2024-05-03 MED ORDER — TRAMADOL HCL 50 MG PO TABS: 50.0000 mg | ORAL_TABLET | Freq: Four times a day (QID) | ORAL | 0 refills | Status: AC | PRN

## 2024-05-18 ENCOUNTER — Encounter: Payer: Self-pay | Admitting: Orthopaedic Surgery

## 2024-05-18 ENCOUNTER — Ambulatory Visit (INDEPENDENT_AMBULATORY_CARE_PROVIDER_SITE_OTHER): Admitting: Orthopaedic Surgery

## 2024-05-18 DIAGNOSIS — Z96652 Presence of left artificial knee joint: Secondary | ICD-10-CM

## 2024-05-18 NOTE — Progress Notes (Signed)
 The patient is an active 78 year old female who is 6 weeks out from a left total knee replacement.  We replaced her right knee in 2017.  She has already been released from physical therapy yesterday due to her progress.  She is walking without assist device.  She not taking pain medicine either.  She reports excellent range of motion of her knee with still some warmth to be expected.  Examination of her left knee shows full extension and about full flexion.  There is still some swelling and what to be expected.  There is a small area of a retained suture that I removed and I am going to have her placed mupirocin ointment on this once a day until it heals.  It should heal and it does not look worrisome.  She can drop from my standpoint and use her exercise bike.  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  We will see her back in 3 months for standing AP and lateral of her more recent left operative knee.  If there are issues before then she knows to reach out.

## 2024-08-03 ENCOUNTER — Ambulatory Visit: Admitting: Physician Assistant

## 2024-08-03 ENCOUNTER — Other Ambulatory Visit: Payer: Self-pay

## 2024-08-03 DIAGNOSIS — M7632 Iliotibial band syndrome, left leg: Secondary | ICD-10-CM | POA: Diagnosis not present

## 2024-08-03 DIAGNOSIS — M25562 Pain in left knee: Secondary | ICD-10-CM | POA: Diagnosis not present

## 2024-08-03 MED ORDER — LIDOCAINE HCL 1 % IJ SOLN
3.0000 mL | INTRAMUSCULAR | Status: AC | PRN
  Administered 2024-08-03: 3 mL

## 2024-08-03 MED ORDER — METHYLPREDNISOLONE ACETATE 40 MG/ML IJ SUSP
40.0000 mg | INTRAMUSCULAR | Status: AC | PRN
  Administered 2024-08-03: 40 mg via INTRA_ARTICULAR

## 2024-08-03 NOTE — Progress Notes (Signed)
 HPI: Brooke Cole comes in today due to lateral left thigh and knee pain.  Ranks these pains to be 4 out of 10 the pain is sharp.  She is also having some hip pain.  Denies any numbness tingling.  Denies any mechanical symptoms of the knee.  She does note she is having swelling of her left leg which she states she did not have with the right total knee.  Left total knee arthroplasty was performed 04/08/2024.  Denies any injuries.  Review of systems: See HPI otherwise negative  Physical exam: General Well-developed well-nourished female no acute distress mood and affect appropriate.  Ambulates without any assistive device.  Bilateral knees: Good range of motion no abnormal warmth erythema or effusion.  No instability valgus varus stressing of either knee.  Tenderness at the distal IT band insertion left knee only.  Bilateral calfs supple nontender. Left hip: Good range of motion without pain.  Tenderness over the left trochanteric region and down the IT band.   Radiographs: Left knee 2 views: Knee is well located.  Total knee arthroplasty components well-seated no signs of loosening or hardware failure.  No sclerotic activity to indicate fracture.  Impression: Left IT band syndrome Status post left total knee arthroplasty 04/08/2024  Plan: She is shown IT band stretching exercises.  She will continue work on strengthening left knee.  Encouraged the use of compression hose during the day on both legs to help with swelling.  Follow-up with us  as scheduled in the near future for 55-month follow-up but no x-rays at that point in time unless clinically indicated.  Offered her a trochanteric injection left hip she was agreeable and tolerated well.    Procedure Note  Patient: Brooke Cole             Date of Birth: 30-Dec-1945           MRN: [id number]             Visit Date: 08/03/2024  Procedures: Visit Diagnoses:  1. Acute pain of left knee     Large Joint Inj: L greater trochanter on 08/03/2024  11:41 AM Indications: pain Details: 22 G 1.5 in needle, lateral approach  Arthrogram: No  Medications: 3 mL lidocaine  1 %; 40 mg methylPREDNISolone  acetate 40 MG/ML Outcome: tolerated well, no immediate complications Procedure, treatment alternatives, risks and benefits explained, specific risks discussed. Consent was given by the patient. Immediately prior to procedure a time out was called to verify the correct patient, procedure, equipment, support staff and site/side marked as required. Patient was prepped and draped in the usual sterile fashion.

## 2024-08-17 ENCOUNTER — Encounter: Payer: Self-pay | Admitting: Orthopaedic Surgery

## 2024-08-17 ENCOUNTER — Ambulatory Visit (INDEPENDENT_AMBULATORY_CARE_PROVIDER_SITE_OTHER): Admitting: Orthopaedic Surgery

## 2024-08-17 DIAGNOSIS — M7632 Iliotibial band syndrome, left leg: Secondary | ICD-10-CM | POA: Diagnosis not present

## 2024-08-17 DIAGNOSIS — Z96652 Presence of left artificial knee joint: Secondary | ICD-10-CM

## 2024-08-17 NOTE — Progress Notes (Signed)
 The patient is a 78 year old who is now 4 months status post a left knee replacement.  She was seen just 2 weeks ago by Tory who did place a steroid injection over her left trochanteric area and this helped with the IT band pain she was having.  I have recommended she use Voltaren gel in this area.  She is only been using it for her knees.  She does look better overall.  She is ambulating without assistive ice and reports increased range of motion and strength.  She would like to get out of a chair better.  However she does not wish to get any more physical therapy because it causes a significant flareup in her asthma and bronchitis.  On exam her left knee is still slightly warm to be expected but she has excellent range of motion of the knee and it does feel stable.  X-rays from earlier this month show well-seated total knee arthroplasty.  She does have a history of a right knee replacement as well.  She will continue her home exercise program and we can see her back in 3 months to see how she is doing overall but no x-rays are needed.

## 2024-10-31 ENCOUNTER — Encounter: Payer: Self-pay | Admitting: Radiology

## 2024-11-16 ENCOUNTER — Ambulatory Visit: Admitting: Orthopaedic Surgery

## 2024-11-16 ENCOUNTER — Encounter: Payer: Self-pay | Admitting: Orthopaedic Surgery

## 2024-11-16 DIAGNOSIS — Z96652 Presence of left artificial knee joint: Secondary | ICD-10-CM

## 2024-11-16 DIAGNOSIS — M7632 Iliotibial band syndrome, left leg: Secondary | ICD-10-CM

## 2024-11-16 NOTE — Progress Notes (Signed)
 The patient comes in today now 8-month status post left total knee replacement.  She is of the knee is doing very well.  She reports good range of motion and strength of the left knee.  We did replace her right knee back in 2017.  She walks without assistive device.  On exam she has good range of motion of her left knee and feels ligamentously stable with minimal discomfort.  Will see her back in 5 months which will be the 1 year standpoint from surgery.  At that visit we will have a standing AP and lateral of her more recent left operative knee.  All questions and concerns were addressed and answered.

## 2024-12-15 NOTE — Progress Notes (Unsigned)
 Brooke Cole - 78 y.o. female MRN [id number]  Date of birth: Dec 28, 1946  Office Visit Note: Visit Date: 12/19/2024 PCP: Fritzi Nest, MD Referred by: Fritzi Nest, MD  Subjective: No chief complaint on file.  HPI: Brooke Cole is a pleasant 78 y.o. female who presents today for ***  Pertinent ROS were reviewed with the patient and found to be negative unless otherwise specified above in HPI.   Visit Reason: Duration of symptoms: Hand dominance: {RIGHT/LEFT:20294} Occupation: Diabetic: {yes/no:20286} Smoking: {yes/no:20286} Heart/Lung History: Blood Thinners:   Prior Testing/EMG: Injections (Date): Treatments: Prior Surgery:    Assessment & Plan: Visit Diagnoses: No diagnosis found.  Plan: ***  Follow-up: No follow-ups on file.   Meds & Orders: No orders of the defined types were placed in this encounter.  No orders of the defined types were placed in this encounter.    Procedures: No procedures performed      Clinical History: No specialty comments available.  She reports that she has never smoked. She has never used smokeless tobacco. No results for input(s): HGBA1C, LABURIC in the last 8760 hours.  Objective:   Vital Signs: There were no vitals taken for this visit.  Physical Exam  Gen: Well-appearing, in no acute distress; non-toxic CV: Regular Rate. Well-perfused. Warm.  Resp: Breathing unlabored on room air; no wheezing. Psych: Fluid speech in conversation; appropriate affect; normal thought process  Ortho Exam - ***   Imaging: No results found.  Past Medical/Family/Surgical/Social History: Medications & Allergies reviewed per EMR, new medications updated. 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 Past Surgical History:  Procedure Laterality Date   ABDOMINAL HYSTERECTOMY     partial 2001   broken hand - 3 screws Right 2013   ORIF right hand   EYE SURGERY     cataract removal bilat with implants   TMJ bilateral meniscectomy  1989   TONSILLECTOMY     1952   TOTAL KNEE ARTHROPLASTY Right 07/11/2016   Procedure: RIGHT TOTAL KNEE ARTHROPLASTY;  Surgeon: Lonni CINDERELLA Poli, MD;  Location: [facility];  Service: Orthopedics;  Laterality: Right;   TOTAL KNEE ARTHROPLASTY Left 04/08/2024   Procedure: ARTHROPLASTY, KNEE, TOTAL;  Surgeon: Poli Lonni CINDERELLA, MD;  Location: [facility];  Service: Orthopedics;  Laterality: Left;   Social History   Occupational History   Not  on file  Tobacco Use   Smoking status: Never   Smokeless tobacco: Never  Vaping Use   Vaping status: Never Used  Substance and Sexual Activity   Alcohol use: No   Drug use: No   Sexual activity: Not Currently    Kwasi Joung Estela) Arlinda, M.D. [facility], Hand Surgery

## 2024-12-19 ENCOUNTER — Ambulatory Visit: Admitting: Orthopedic Surgery

## 2024-12-19 ENCOUNTER — Other Ambulatory Visit (INDEPENDENT_AMBULATORY_CARE_PROVIDER_SITE_OTHER): Payer: Self-pay

## 2024-12-19 DIAGNOSIS — M79641 Pain in right hand: Secondary | ICD-10-CM | POA: Diagnosis not present

## 2024-12-19 DIAGNOSIS — M1811 Unilateral primary osteoarthritis of first carpometacarpal joint, right hand: Secondary | ICD-10-CM | POA: Diagnosis not present

## 2024-12-19 MED ORDER — BETAMETHASONE SOD PHOS & ACET 6 (3-3) MG/ML IJ SUSP
6.0000 mg | INTRAMUSCULAR | Status: AC | PRN
  Administered 2024-12-19: 6 mg via INTRA_ARTICULAR

## 2024-12-19 MED ORDER — LIDOCAINE HCL 1 % IJ SOLN
1.0000 mL | INTRAMUSCULAR | Status: AC | PRN
  Administered 2024-12-19: 1 mL

## 2025-01-30 ENCOUNTER — Ambulatory Visit: Admitting: Orthopedic Surgery

## 2025-04-17 ENCOUNTER — Ambulatory Visit: Admitting: Orthopaedic Surgery
